# Patient Record
Sex: Male | Born: 1960 | Race: White | Hispanic: No | Marital: Married | Smoking: Current some day smoker
Health system: Southern US, Community
[De-identification: ages and names within clinical notes are randomized; demographics above are authoritative.]

## PROBLEM LIST (undated history)

## (undated) DIAGNOSIS — M1990 Unspecified osteoarthritis, unspecified site: Secondary | ICD-10-CM

## (undated) DIAGNOSIS — G4730 Sleep apnea, unspecified: Secondary | ICD-10-CM

## (undated) HISTORY — PX: ANTERIOR CRUCIATE LIGAMENT REPAIR: SHX115

---

## 1998-07-30 ENCOUNTER — Ambulatory Visit (HOSPITAL_BASED_OUTPATIENT_CLINIC_OR_DEPARTMENT_OTHER): Admission: RE | Admit: 1998-07-30 | Discharge: 1998-07-30 | Payer: Self-pay | Admitting: *Deleted

## 2015-11-02 ENCOUNTER — Other Ambulatory Visit: Payer: Self-pay | Admitting: Orthopaedic Surgery

## 2015-11-18 ENCOUNTER — Encounter (HOSPITAL_COMMUNITY): Payer: Self-pay

## 2015-11-18 ENCOUNTER — Encounter (HOSPITAL_COMMUNITY)
Admission: RE | Admit: 2015-11-18 | Discharge: 2015-11-18 | Disposition: A | Discharge status: Home / Self Care | Payer: BLUE CROSS/BLUE SHIELD | Source: Ambulatory Visit | Attending: Orthopaedic Surgery | Admitting: Orthopaedic Surgery

## 2015-11-18 DIAGNOSIS — Z01812 Encounter for preprocedural laboratory examination: Secondary | ICD-10-CM | POA: Insufficient documentation

## 2015-11-18 DIAGNOSIS — Z01810 Encounter for preprocedural cardiovascular examination: Secondary | ICD-10-CM | POA: Insufficient documentation

## 2015-11-18 DIAGNOSIS — M170 Bilateral primary osteoarthritis of knee: Secondary | ICD-10-CM | POA: Diagnosis not present

## 2015-11-18 HISTORY — DX: Unspecified osteoarthritis, unspecified site: M19.90

## 2015-11-18 HISTORY — DX: Sleep apnea, unspecified: G47.30

## 2015-11-18 LAB — CBC WITH DIFFERENTIAL/PLATELET
Basophils Absolute: 0 10*3/uL (ref 0.0–0.1)
Basophils Relative: 0 %
EOS ABS: 0.2 10*3/uL (ref 0.0–0.7)
Eosinophils Relative: 2 %
HEMATOCRIT: 43.5 % (ref 39.0–52.0)
HEMOGLOBIN: 14.9 g/dL (ref 13.0–17.0)
LYMPHS ABS: 3.1 10*3/uL (ref 0.7–4.0)
Lymphocytes Relative: 30 %
MCH: 29.4 pg (ref 26.0–34.0)
MCHC: 34.3 g/dL (ref 30.0–36.0)
MCV: 86 fL (ref 78.0–100.0)
MONO ABS: 0.7 10*3/uL (ref 0.1–1.0)
MONOS PCT: 7 %
NEUTROS ABS: 6.2 10*3/uL (ref 1.7–7.7)
NEUTROS PCT: 61 %
Platelets: 216 10*3/uL (ref 150–400)
RBC: 5.06 MIL/uL (ref 4.22–5.81)
RDW: 12.9 % (ref 11.5–15.5)
WBC: 10.2 10*3/uL (ref 4.0–10.5)

## 2015-11-18 LAB — URINALYSIS, ROUTINE W REFLEX MICROSCOPIC
Bilirubin Urine: NEGATIVE
GLUCOSE, UA: NEGATIVE mg/dL
HGB URINE DIPSTICK: NEGATIVE
Ketones, ur: NEGATIVE mg/dL
Leukocytes, UA: NEGATIVE
Nitrite: NEGATIVE
PH: 7.5 (ref 5.0–8.0)
Protein, ur: NEGATIVE mg/dL
SPECIFIC GRAVITY, URINE: 1.023 (ref 1.005–1.030)

## 2015-11-18 LAB — TYPE AND SCREEN
ABO/RH(D): O POS
Antibody Screen: NEGATIVE

## 2015-11-18 LAB — ABO/RH: ABO/RH(D): O POS

## 2015-11-18 LAB — COMPREHENSIVE METABOLIC PANEL
ALK PHOS: 61 U/L (ref 38–126)
ALT: 34 U/L (ref 17–63)
ANION GAP: 8 (ref 5–15)
AST: 25 U/L (ref 15–41)
Albumin: 4.1 g/dL (ref 3.5–5.0)
BILIRUBIN TOTAL: 0.7 mg/dL (ref 0.3–1.2)
BUN: 7 mg/dL (ref 6–20)
CALCIUM: 8.9 mg/dL (ref 8.9–10.3)
CO2: 26 mmol/L (ref 22–32)
Chloride: 105 mmol/L (ref 101–111)
Creatinine, Ser: 0.69 mg/dL (ref 0.61–1.24)
Glucose, Bld: 105 mg/dL — ABNORMAL HIGH (ref 65–99)
POTASSIUM: 3.8 mmol/L (ref 3.5–5.1)
Sodium: 139 mmol/L (ref 135–145)
TOTAL PROTEIN: 6.9 g/dL (ref 6.5–8.1)

## 2015-11-18 LAB — PROTIME-INR
INR: 0.96
PROTHROMBIN TIME: 12.8 s (ref 11.4–15.2)

## 2015-11-18 LAB — APTT: aPTT: 27 seconds (ref 24–36)

## 2015-11-18 LAB — SURGICAL PCR SCREEN
MRSA, PCR: NEGATIVE
STAPHYLOCOCCUS AUREUS: POSITIVE — AB

## 2015-11-18 LAB — C-REACTIVE PROTEIN

## 2015-11-18 LAB — SEDIMENTATION RATE: Sed Rate: 3 mm/hr (ref 0–16)

## 2015-11-18 NOTE — Pre-Procedure Instructions (Signed)
    Daniel Rivas  11/18/2015      [id number] Otto- [place], [place] - [address]  [place] [id number] Phone: [phone] Fax: [phone]    Your procedure is scheduled on 11/25/15.  [facility] at 530 A.M.  Call this number if you have problems the morning of surgery:  [phone]   Remember:  Do not eat food or drink liquids after midnight.  Take these medicines the morning of surgery with A SIP OF WATER none   Do not wear jewelry, make-up or nail polish.  Do not wear lotions, powders, or perfumes, or deoderant.  Do not shave 48 hours prior to surgery.  Men may shave face and neck.  Do not bring valuables to the hospital.  [facility] is not responsible for any belongings or valuables.  Contacts, dentures or bridgework may not be worn into surgery.  Leave your suitcase in the car.  After surgery it may be brought to your room.  For patients admitted to the hospital, discharge time will be determined by your treatment team.  Patients discharged the day of surgery will not be allowed to drive home.   Name and phone number of your driver:   Special instructions:  Do not take any aspirin,anti-inflammatories,vitamins,or herbal supplements 5-7 days prior to surgery. Please read over the following fact sheets that you were given. MRSA Information

## 2015-11-22 MED ORDER — TRANEXAMIC ACID 1000 MG/10ML IV SOLN
1000.0000 mg | INTRAVENOUS | Status: AC
  Administered 2015-11-25: 1000 mg via INTRAVENOUS
  Filled 2015-11-22 (×2): qty 10

## 2015-11-24 MED ORDER — DEXTROSE 5 % IV SOLN
3.0000 g | INTRAVENOUS | Status: DC
  Filled 2015-11-24: qty 3000

## 2015-11-24 MED ORDER — BUPIVACAINE LIPOSOME 1.3 % IJ SUSP
20.0000 mL | INTRAMUSCULAR | Status: DC
  Filled 2015-11-24 (×2): qty 20

## 2015-11-24 MED ORDER — CEFAZOLIN SODIUM-DEXTROSE 2-4 GM/100ML-% IV SOLN
2.0000 g | Freq: Once | INTRAVENOUS | Status: AC
  Administered 2015-11-25 (×2): 2 g via INTRAVENOUS
  Filled 2015-11-24: qty 100

## 2015-11-24 NOTE — Anesthesia Preprocedure Evaluation (Addendum)
Anesthesia Evaluation  Patient identified by MRN, date of birth, ID band Patient awake    Reviewed: Allergy & Precautions, H&P , NPO status , Patient's Chart, lab work & pertinent test results  Airway Mallampati: III  TM Distance: >3 FB Neck ROM: Full    Dental no notable dental hx. (+) Dental Advidsory Given, Teeth Intact   Pulmonary sleep apnea and Continuous Positive Airway Pressure Ventilation , Current Smoker,    Pulmonary exam normal breath sounds clear to auscultation       Cardiovascular negative cardio ROS   Rhythm:Regular Rate:Normal     Neuro/Psych negative neurological ROS  negative psych ROS   GI/Hepatic negative GI ROS, Neg liver ROS,   Endo/Other  Morbid obesity  Renal/GU negative Renal ROS  negative genitourinary   Musculoskeletal  (+) Arthritis , Osteoarthritis,    Abdominal   Peds  Hematology negative hematology ROS (+)   Anesthesia Other Findings   Reproductive/Obstetrics negative OB ROS                           Anesthesia Physical Anesthesia Plan  ASA: III  Anesthesia Plan: MAC and Spinal   Post-op Pain Management:    Induction: Intravenous  Airway Management Planned: Simple Face Mask  Additional Equipment:   Intra-op Plan:   Post-operative Plan:   Informed Consent: I have reviewed the patients History and Physical, chart, labs and discussed the procedure including the risks, benefits and alternatives for the proposed anesthesia with the patient or authorized representative who has indicated his/her understanding and acceptance.   Dental advisory given and Dental Advisory Given  Plan Discussed with: CRNA  Anesthesia Plan Comments:        Anesthesia Quick Evaluation

## 2015-11-25 ENCOUNTER — Inpatient Hospital Stay (HOSPITAL_COMMUNITY): Payer: BLUE CROSS/BLUE SHIELD | Admitting: Anesthesiology

## 2015-11-25 ENCOUNTER — Inpatient Hospital Stay (HOSPITAL_COMMUNITY): Payer: BLUE CROSS/BLUE SHIELD

## 2015-11-25 ENCOUNTER — Encounter (HOSPITAL_COMMUNITY): Payer: Self-pay | Admitting: *Deleted

## 2015-11-25 ENCOUNTER — Inpatient Hospital Stay (HOSPITAL_COMMUNITY)
Admission: RE | Admit: 2015-11-25 | Discharge: 2015-11-27 | DRG: 462 | Disposition: A | Discharge status: Home Health | Payer: BLUE CROSS/BLUE SHIELD | Source: Ambulatory Visit | Attending: Orthopaedic Surgery | Admitting: Orthopaedic Surgery

## 2015-11-25 ENCOUNTER — Encounter (HOSPITAL_COMMUNITY)
Admission: RE | Disposition: A | Discharge status: Home Health | Payer: Self-pay | Source: Ambulatory Visit | Attending: Orthopaedic Surgery

## 2015-11-25 DIAGNOSIS — D62 Acute posthemorrhagic anemia: Secondary | ICD-10-CM | POA: Diagnosis not present

## 2015-11-25 DIAGNOSIS — F17290 Nicotine dependence, other tobacco product, uncomplicated: Secondary | ICD-10-CM | POA: Diagnosis present

## 2015-11-25 DIAGNOSIS — Z96659 Presence of unspecified artificial knee joint: Secondary | ICD-10-CM

## 2015-11-25 DIAGNOSIS — Z96652 Presence of left artificial knee joint: Secondary | ICD-10-CM

## 2015-11-25 DIAGNOSIS — M170 Bilateral primary osteoarthritis of knee: Principal | ICD-10-CM | POA: Diagnosis present

## 2015-11-25 DIAGNOSIS — G4730 Sleep apnea, unspecified: Secondary | ICD-10-CM | POA: Diagnosis present

## 2015-11-25 DIAGNOSIS — Z6841 Body Mass Index (BMI) 40.0 and over, adult: Secondary | ICD-10-CM

## 2015-11-25 DIAGNOSIS — M25569 Pain in unspecified knee: Secondary | ICD-10-CM | POA: Diagnosis present

## 2015-11-25 HISTORY — PX: TOTAL KNEE ARTHROPLASTY: SHX125

## 2015-11-25 LAB — CBC
HCT: 38.1 % — ABNORMAL LOW (ref 39.0–52.0)
Hemoglobin: 12.4 g/dL — ABNORMAL LOW (ref 13.0–17.0)
MCH: 28.6 pg (ref 26.0–34.0)
MCHC: 32.5 g/dL (ref 30.0–36.0)
MCV: 87.8 fL (ref 78.0–100.0)
PLATELETS: 204 10*3/uL (ref 150–400)
RBC: 4.34 MIL/uL (ref 4.22–5.81)
RDW: 13.1 % (ref 11.5–15.5)
WBC: 19.9 10*3/uL — ABNORMAL HIGH (ref 4.0–10.5)

## 2015-11-25 LAB — CREATININE, SERUM
Creatinine, Ser: 0.94 mg/dL (ref 0.61–1.24)
GFR calc Af Amer: >60 mL/min (ref >60)
GFR calc non Af Amer: >60 mL/min (ref >60)

## 2015-11-25 SURGERY — ARTHROPLASTY, KNEE, BILATERAL, TOTAL
Anesthesia: Monitor Anesthesia Care | Site: Knee | Laterality: Bilateral

## 2015-11-25 MED ORDER — SODIUM CHLORIDE 0.9 % IR SOLN
Status: DC | PRN
  Administered 2015-11-25 (×2): 1

## 2015-11-25 MED ORDER — ONDANSETRON HCL 4 MG PO TABS: 4.0000 mg | ORAL_TABLET | Freq: Four times a day (QID) | ORAL | Status: DC | PRN

## 2015-11-25 MED ORDER — ACETAMINOPHEN 500 MG PO TABS
1000.0000 mg | ORAL_TABLET | Freq: Once | ORAL | Status: AC
  Administered 2015-11-25: 1000 mg via ORAL
  Filled 2015-11-25 (×2): qty 2

## 2015-11-25 MED ORDER — GABAPENTIN 300 MG PO CAPS
300.0000 mg | ORAL_CAPSULE | Freq: Once | ORAL | Status: AC
  Administered 2015-11-25: 300 mg via ORAL
  Filled 2015-11-25: qty 1

## 2015-11-25 MED ORDER — OXYCODONE HCL 5 MG PO TABS
5.0000 mg | ORAL_TABLET | ORAL | Status: DC | PRN
  Administered 2015-11-26: 15 mg via ORAL
  Filled 2015-11-25: qty 3

## 2015-11-25 MED ORDER — ACETAMINOPHEN 325 MG PO TABS
650.0000 mg | ORAL_TABLET | Freq: Four times a day (QID) | ORAL | Status: DC | PRN
  Administered 2015-11-26: 650 mg via ORAL
  Filled 2015-11-25: qty 2

## 2015-11-25 MED ORDER — PROPOFOL 500 MG/50ML IV EMUL
INTRAVENOUS | Status: DC | PRN
  Administered 2015-11-25 (×3): via INTRAVENOUS
  Administered 2015-11-25: 65 ug/kg/min via INTRAVENOUS

## 2015-11-25 MED ORDER — SODIUM CHLORIDE 0.9 % IV SOLN
INTRAVENOUS | Status: DC
  Administered 2015-11-25: 16:00:00 via INTRAVENOUS

## 2015-11-25 MED ORDER — METHOCARBAMOL 750 MG PO TABS: 750.0000 mg | ORAL_TABLET | Freq: Two times a day (BID) | ORAL | 0 refills | Status: AC | PRN

## 2015-11-25 MED ORDER — OXYCODONE HCL 5 MG PO TABS: 5.0000 mg | ORAL_TABLET | ORAL | 0 refills | Status: AC | PRN

## 2015-11-25 MED ORDER — POVIDONE-IODINE 10 % EX SOLN
CUTANEOUS | Status: DC | PRN
  Administered 2015-11-25 (×2): 1 via TOPICAL

## 2015-11-25 MED ORDER — PROPOFOL 10 MG/ML IV BOLUS
INTRAVENOUS | Status: AC
  Filled 2015-11-25: qty 20

## 2015-11-25 MED ORDER — PHENYLEPHRINE HCL 10 MG/ML IJ SOLN
INTRAMUSCULAR | Status: DC | PRN
  Administered 2015-11-25: 80 ug via INTRAVENOUS

## 2015-11-25 MED ORDER — PROPOFOL 500 MG/50ML IV EMUL
INTRAVENOUS | Status: AC
  Filled 2015-11-25: qty 50

## 2015-11-25 MED ORDER — ONDANSETRON HCL 4 MG PO TABS: 4.0000 mg | ORAL_TABLET | Freq: Three times a day (TID) | ORAL | 0 refills | Status: AC | PRN

## 2015-11-25 MED ORDER — PROMETHAZINE HCL 25 MG PO TABS
25.0000 mg | ORAL_TABLET | Freq: Four times a day (QID) | ORAL | 1 refills | Status: AC | PRN
Start: 2015-11-25 — End: ?

## 2015-11-25 MED ORDER — CEFAZOLIN SODIUM-DEXTROSE 2-4 GM/100ML-% IV SOLN
2.0000 g | Freq: Four times a day (QID) | INTRAVENOUS | Status: AC
  Administered 2015-11-25 (×2): 2 g via INTRAVENOUS
  Filled 2015-11-25 (×2): qty 100

## 2015-11-25 MED ORDER — ENOXAPARIN SODIUM 30 MG/0.3ML ~~LOC~~ SOLN
30.0000 mg | Freq: Two times a day (BID) | SUBCUTANEOUS | Status: DC
  Administered 2015-11-26 – 2015-11-27 (×3): 30 mg via SUBCUTANEOUS
  Filled 2015-11-25 (×3): qty 0.3

## 2015-11-25 MED ORDER — 0.9 % SODIUM CHLORIDE (POUR BTL) OPTIME
TOPICAL | Status: DC | PRN
  Administered 2015-11-25 (×2): 500 mL

## 2015-11-25 MED ORDER — PHENYLEPHRINE 40 MCG/ML (10ML) SYRINGE FOR IV PUSH (FOR BLOOD PRESSURE SUPPORT)
PREFILLED_SYRINGE | INTRAVENOUS | Status: AC
  Filled 2015-11-25: qty 10

## 2015-11-25 MED ORDER — KETOROLAC TROMETHAMINE 30 MG/ML IJ SOLN: 30.0000 mg | Freq: Four times a day (QID) | INTRAMUSCULAR | Status: AC | PRN

## 2015-11-25 MED ORDER — MIDAZOLAM HCL 2 MG/2ML IJ SOLN
INTRAMUSCULAR | Status: AC
  Filled 2015-11-25: qty 2

## 2015-11-25 MED ORDER — CELECOXIB 200 MG PO CAPS
200.0000 mg | ORAL_CAPSULE | Freq: Once | ORAL | Status: AC
  Administered 2015-11-25: 200 mg via ORAL
  Filled 2015-11-25: qty 1

## 2015-11-25 MED ORDER — ENOXAPARIN SODIUM 40 MG/0.4ML ~~LOC~~ SOLN: 40.0000 mg | Freq: Every day | SUBCUTANEOUS | 0 refills | Status: AC

## 2015-11-25 MED ORDER — FENTANYL CITRATE (PF) 100 MCG/2ML IJ SOLN
INTRAMUSCULAR | Status: DC | PRN
  Administered 2015-11-25 (×2): 25 ug via INTRAVENOUS
  Administered 2015-11-25: 50 ug via INTRAVENOUS
  Administered 2015-11-25: 100 ug via INTRAVENOUS

## 2015-11-25 MED ORDER — DIPHENHYDRAMINE HCL 12.5 MG/5ML PO ELIX: 25.0000 mg | ORAL_SOLUTION | ORAL | Status: DC | PRN

## 2015-11-25 MED ORDER — FENTANYL CITRATE (PF) 100 MCG/2ML IJ SOLN
INTRAMUSCULAR | Status: AC
  Filled 2015-11-25: qty 2

## 2015-11-25 MED ORDER — CEFAZOLIN SODIUM 1 G IJ SOLR
INTRAMUSCULAR | Status: AC
  Filled 2015-11-25: qty 20

## 2015-11-25 MED ORDER — CHLORHEXIDINE GLUCONATE 4 % EX LIQD: 60.0000 mL | Freq: Once | CUTANEOUS | Status: DC

## 2015-11-25 MED ORDER — OXYCODONE HCL ER 15 MG PO T12A
15.0000 mg | EXTENDED_RELEASE_TABLET | Freq: Two times a day (BID) | ORAL | Status: DC
  Administered 2015-11-25 – 2015-11-27 (×5): 15 mg via ORAL
  Filled 2015-11-25 (×5): qty 1

## 2015-11-25 MED ORDER — PHENOL 1.4 % MT LIQD
1.0000 | OROMUCOSAL | Status: DC | PRN
Start: 2015-11-25 — End: 2015-11-27

## 2015-11-25 MED ORDER — PROPOFOL 10 MG/ML IV BOLUS
INTRAVENOUS | Status: DC | PRN
  Administered 2015-11-25: 30 mg via INTRAVENOUS

## 2015-11-25 MED ORDER — BUPIVACAINE HCL (PF) 0.5 % IJ SOLN
INTRAMUSCULAR | Status: DC | PRN
Start: 2015-11-25 — End: 2015-11-25
  Administered 2015-11-25: 15 mL via INTRATHECAL

## 2015-11-25 MED ORDER — MENTHOL 3 MG MT LOZG
1.0000 | LOZENGE | OROMUCOSAL | Status: DC | PRN
Start: 2015-11-25 — End: 2015-11-27

## 2015-11-25 MED ORDER — SODIUM CHLORIDE 0.9 % IJ SOLN
INTRAMUSCULAR | Status: DC | PRN
  Administered 2015-11-25 (×2): 40 mL

## 2015-11-25 MED ORDER — MAGNESIUM CITRATE PO SOLN: 1.0000 | Freq: Once | ORAL | Status: DC | PRN

## 2015-11-25 MED ORDER — SENNOSIDES-DOCUSATE SODIUM 8.6-50 MG PO TABS
1.0000 | ORAL_TABLET | Freq: Every evening | ORAL | 1 refills | Status: AC | PRN
Start: 2015-11-25 — End: ?

## 2015-11-25 MED ORDER — ALUM & MAG HYDROXIDE-SIMETH 200-200-20 MG/5ML PO SUSP: 30.0000 mL | ORAL | Status: DC | PRN

## 2015-11-25 MED ORDER — LACTATED RINGERS IV SOLN
INTRAVENOUS | Status: DC | PRN
  Administered 2015-11-25 (×3): via INTRAVENOUS

## 2015-11-25 MED ORDER — PROPOFOL 1000 MG/100ML IV EMUL
INTRAVENOUS | Status: AC
  Filled 2015-11-25: qty 100

## 2015-11-25 MED ORDER — DEXAMETHASONE SODIUM PHOSPHATE 10 MG/ML IJ SOLN
10.0000 mg | Freq: Once | INTRAMUSCULAR | Status: AC
  Administered 2015-11-26: 10 mg via INTRAVENOUS
  Filled 2015-11-25: qty 1

## 2015-11-25 MED ORDER — METHOCARBAMOL 1000 MG/10ML IJ SOLN
500.0000 mg | Freq: Four times a day (QID) | INTRAVENOUS | Status: DC | PRN
  Filled 2015-11-25: qty 5

## 2015-11-25 MED ORDER — METHOCARBAMOL 500 MG PO TABS
500.0000 mg | ORAL_TABLET | Freq: Four times a day (QID) | ORAL | Status: DC | PRN
  Administered 2015-11-25 – 2015-11-26 (×2): 500 mg via ORAL
  Filled 2015-11-25 (×2): qty 1

## 2015-11-25 MED ORDER — BUPIVACAINE LIPOSOME 1.3 % IJ SUSP
INTRAMUSCULAR | Status: DC | PRN
  Administered 2015-11-25 (×2): 20 mL

## 2015-11-25 MED ORDER — ONDANSETRON HCL 4 MG/2ML IJ SOLN: 4.0000 mg | Freq: Four times a day (QID) | INTRAMUSCULAR | Status: DC | PRN

## 2015-11-25 MED ORDER — MORPHINE SULFATE (PF) 2 MG/ML IV SOLN: 1.0000 mg | INTRAVENOUS | Status: DC | PRN

## 2015-11-25 MED ORDER — ACETAMINOPHEN 500 MG PO TABS
1000.0000 mg | ORAL_TABLET | Freq: Four times a day (QID) | ORAL | Status: AC
  Administered 2015-11-25 – 2015-11-26 (×4): 1000 mg via ORAL
  Filled 2015-11-25 (×4): qty 2

## 2015-11-25 MED ORDER — ACETAMINOPHEN 650 MG RE SUPP: 650.0000 mg | Freq: Four times a day (QID) | RECTAL | Status: DC | PRN

## 2015-11-25 MED ORDER — LIDOCAINE HCL (CARDIAC) 20 MG/ML IV SOLN
INTRAVENOUS | Status: DC | PRN
  Administered 2015-11-25: 20 mg via INTRAVENOUS

## 2015-11-25 MED ORDER — BUPIVACAINE LIPOSOME 1.3 % IJ SUSP
20.0000 mL | INTRAMUSCULAR | Status: DC
  Filled 2015-11-25: qty 20

## 2015-11-25 MED ORDER — ONDANSETRON HCL 4 MG/2ML IJ SOLN
INTRAMUSCULAR | Status: AC
  Filled 2015-11-25: qty 2

## 2015-11-25 MED ORDER — OXYCODONE HCL ER 10 MG PO T12A: 10.0000 mg | EXTENDED_RELEASE_TABLET | Freq: Two times a day (BID) | ORAL | 0 refills | Status: AC

## 2015-11-25 MED ORDER — METOCLOPRAMIDE HCL 5 MG PO TABS: 5.0000 mg | ORAL_TABLET | Freq: Three times a day (TID) | ORAL | Status: DC | PRN

## 2015-11-25 MED ORDER — PHENYLEPHRINE HCL 10 MG/ML IJ SOLN
INTRAMUSCULAR | Status: DC | PRN
  Administered 2015-11-25: 25 ug/min via INTRAVENOUS
  Administered 2015-11-25: 10:00:00 via INTRAVENOUS

## 2015-11-25 MED ORDER — METOCLOPRAMIDE HCL 5 MG/ML IJ SOLN: 5.0000 mg | Freq: Three times a day (TID) | INTRAMUSCULAR | Status: DC | PRN

## 2015-11-25 MED ORDER — CEFAZOLIN SODIUM-DEXTROSE 2-4 GM/100ML-% IV SOLN
INTRAVENOUS | Status: AC
Start: 2015-11-25 — End: 2015-11-25
  Filled 2015-11-25: qty 100

## 2015-11-25 MED ORDER — POLYETHYLENE GLYCOL 3350 17 G PO PACK: 17.0000 g | PACK | Freq: Every day | ORAL | Status: DC | PRN

## 2015-11-25 MED ORDER — HYDROMORPHONE HCL 1 MG/ML IJ SOLN: 0.2500 mg | INTRAMUSCULAR | Status: DC | PRN

## 2015-11-25 MED ORDER — MIDAZOLAM HCL 5 MG/5ML IJ SOLN
INTRAMUSCULAR | Status: DC | PRN
  Administered 2015-11-25: 2 mg via INTRAVENOUS

## 2015-11-25 MED ORDER — SORBITOL 70 % SOLN: 30.0000 mL | Freq: Every day | Status: DC | PRN

## 2015-11-25 SURGICAL SUPPLY — 75 items
ALCOHOL ISOPROPYL (RUBBING) (MISCELLANEOUS) ×2 IMPLANT
BAG DECANTER FOR FLEXI CONT (MISCELLANEOUS) ×2 IMPLANT
BANDAGE ACE 6X5 VEL STRL LF (GAUZE/BANDAGES/DRESSINGS) ×2 IMPLANT
BANDAGE ESMARK 6X9 LF (GAUZE/BANDAGES/DRESSINGS) ×1 IMPLANT
BENZOIN TINCTURE PRP APPL 2/3 (GAUZE/BANDAGES/DRESSINGS) ×2 IMPLANT
BLADE SAW SGTL 13.0X1.19X90.0M (BLADE) ×4 IMPLANT
BNDG COHESIVE 6X5 TAN STRL LF (GAUZE/BANDAGES/DRESSINGS) ×2 IMPLANT
BNDG ESMARK 6X9 LF (GAUZE/BANDAGES/DRESSINGS) ×2
BONE CEMENT PALACOS R-G (Orthopedic Implant) ×8 IMPLANT
BOWL SMART MIX CTS (DISPOSABLE) ×4 IMPLANT
CAPT KNEE TOTAL 3 ×4 IMPLANT
CEMENT BONE PALACOS R-G (Orthopedic Implant) ×4 IMPLANT
CLSR STERI-STRIP ANTIMIC 1/2X4 (GAUZE/BANDAGES/DRESSINGS) IMPLANT
COVER SURGICAL LIGHT HANDLE (MISCELLANEOUS) ×4 IMPLANT
CUFF TOURNIQUET SINGLE 34IN LL (TOURNIQUET CUFF) ×4 IMPLANT
CUFF TOURNIQUET SINGLE 44IN (TOURNIQUET CUFF) IMPLANT
DRAPE EXTREMITY T 121X128X90 (DRAPE) ×2 IMPLANT
DRAPE HALF SHEET 40X57 (DRAPES) ×8 IMPLANT
DRAPE IMP U-DRAPE 54X76 (DRAPES) ×4 IMPLANT
DRAPE INCISE IOBAN 66X45 STRL (DRAPES) ×4 IMPLANT
DRAPE ORTHO SPLIT 77X108 STRL (DRAPES) ×4
DRAPE PROXIMA HALF (DRAPES) ×2 IMPLANT
DRAPE SURG 17X11 SM STRL (DRAPES) ×4 IMPLANT
DRAPE SURG ORHT 6 SPLT 77X108 (DRAPES) ×4 IMPLANT
DRAPE UNIVERSAL PACK (DRAPES) ×2 IMPLANT
DRESSING AQUACEL AG SP 3.5X10 (GAUZE/BANDAGES/DRESSINGS) ×1 IMPLANT
DRSG AQUACEL AG ADV 3.5X14 (GAUZE/BANDAGES/DRESSINGS) ×2 IMPLANT
DRSG AQUACEL AG SP 3.5X10 (GAUZE/BANDAGES/DRESSINGS) ×2
DURAPREP 26ML APPLICATOR (WOUND CARE) ×8 IMPLANT
ELECT CAUTERY BLADE 6.4 (BLADE) ×2 IMPLANT
ELECT REM PT RETURN 9FT ADLT (ELECTROSURGICAL) ×4
ELECTRODE REM PT RTRN 9FT ADLT (ELECTROSURGICAL) ×2 IMPLANT
GLOVE BIOGEL PI IND STRL 6 (GLOVE) ×2 IMPLANT
GLOVE BIOGEL PI IND STRL 7.0 (GLOVE) ×2 IMPLANT
GLOVE BIOGEL PI IND STRL 7.5 (GLOVE) ×2 IMPLANT
GLOVE BIOGEL PI INDICATOR 6 (GLOVE) ×2
GLOVE BIOGEL PI INDICATOR 7.0 (GLOVE) ×2
GLOVE BIOGEL PI INDICATOR 7.5 (GLOVE) ×2
GLOVE SKINSENSE NS SZ7.5 (GLOVE) ×5
GLOVE SKINSENSE STRL SZ7.5 (GLOVE) ×5 IMPLANT
GLOVE SURG SYN 7.5  E (GLOVE) ×4
GLOVE SURG SYN 7.5 E (GLOVE) ×4 IMPLANT
GOWN STRL REIN XL XLG (GOWN DISPOSABLE) ×4 IMPLANT
GOWN STRL REUS W/ TWL LRG LVL3 (GOWN DISPOSABLE) ×2 IMPLANT
GOWN STRL REUS W/TWL LRG LVL3 (GOWN DISPOSABLE) ×2
HANDPIECE INTERPULSE COAX TIP (DISPOSABLE) ×2
HOOD PEEL AWAY FLYTE STAYCOOL (MISCELLANEOUS) ×8 IMPLANT
KIT BASIN OR (CUSTOM PROCEDURE TRAY) ×2 IMPLANT
KIT ROOM TURNOVER OR (KITS) ×2 IMPLANT
MANIFOLD NEPTUNE II (INSTRUMENTS) ×2 IMPLANT
NEEDLE SPNL 18GX3.5 QUINCKE PK (NEEDLE) ×2 IMPLANT
NS IRRIG 1000ML POUR BTL (IV SOLUTION) ×4 IMPLANT
PACK TOTAL JOINT (CUSTOM PROCEDURE TRAY) ×2 IMPLANT
PAD ARMBOARD 7.5X6 YLW CONV (MISCELLANEOUS) ×4 IMPLANT
PEN SKIN MARKING BROAD (MISCELLANEOUS) ×2 IMPLANT
SAW OSC TIP CART 19.5X105X1.3 (SAW) ×2 IMPLANT
SEALER BIPOLAR AQUA 6.0 (INSTRUMENTS) ×2 IMPLANT
SET HNDPC FAN SPRY TIP SCT (DISPOSABLE) ×2 IMPLANT
STAPLER VISISTAT 35W (STAPLE) ×4 IMPLANT
STOCKINETTE IMPERVIOUS LG (DRAPES) ×2 IMPLANT
SUCTION FRAZIER HANDLE 10FR (MISCELLANEOUS) ×1
SUCTION TUBE FRAZIER 10FR DISP (MISCELLANEOUS) ×1 IMPLANT
SUT ETHILON 2 0 FS 18 (SUTURE) IMPLANT
SUT MNCRL AB 4-0 PS2 18 (SUTURE) IMPLANT
SUT VIC AB 0 CT1 27 (SUTURE) ×3
SUT VIC AB 0 CT1 27XBRD ANBCTR (SUTURE) ×3 IMPLANT
SUT VIC AB 1 CTX 27 (SUTURE) ×14 IMPLANT
SUT VIC AB 2-0 CT1 27 (SUTURE) ×4
SUT VIC AB 2-0 CT1 TAPERPNT 27 (SUTURE) ×4 IMPLANT
SYR 20CC LL (SYRINGE) ×2 IMPLANT
SYR 50ML LL SCALE MARK (SYRINGE) ×2 IMPLANT
TOWEL OR 17X24 6PK STRL BLUE (TOWEL DISPOSABLE) ×2 IMPLANT
TOWEL OR 17X26 10 PK STRL BLUE (TOWEL DISPOSABLE) ×2 IMPLANT
TRAY CATH 16FR W/PLASTIC CATH (SET/KITS/TRAYS/PACK) ×2 IMPLANT
WRAP KNEE MAXI GEL POST OP (GAUZE/BANDAGES/DRESSINGS) ×4 IMPLANT

## 2015-11-25 NOTE — Transfer of Care (Signed)
Immediate Anesthesia Transfer of Care Note  Patient: Daniel Rivas  Procedure(s) Performed: Procedure(s): TOTAL KNEE BILATERAL (Bilateral)  Patient Location: PACU  Anesthesia Type:Spinal  Level of Consciousness: awake, alert  and oriented  Airway & Oxygen Therapy: Patient Spontanous Breathing and Patient connected to nasal cannula oxygen  Post-op Assessment: Report given to RN and Post -op Vital signs reviewed and stable  Post vital signs: Reviewed and stable  Last Vitals:  Vitals:   11/25/15 0555 11/25/15 1223  BP: (!) 157/88 110/70  Pulse: 84 77  Resp: 20 19  Temp: 37.3 C 36.3 C    Last Pain:  Vitals:   11/25/15 0555  TempSrc: Oral      Patients Stated Pain Goal: 3 (11/25/15 0555)  Complications: No apparent anesthesia complications

## 2015-11-25 NOTE — Anesthesia Postprocedure Evaluation (Signed)
Anesthesia Post Note  Patient: Daniel Rivas  Procedure(s) Performed: Procedure(s) (LRB): TOTAL KNEE BILATERAL (Bilateral)  Patient location during evaluation: PACU Anesthesia Type: Spinal and MAC Level of consciousness: awake and alert Pain management: pain level controlled Vital Signs Assessment: post-procedure vital signs reviewed and stable Respiratory status: spontaneous breathing and respiratory function stable Cardiovascular status: blood pressure returned to baseline and stable Postop Assessment: spinal receding Anesthetic complications: no    Last Vitals:  Vitals:   11/25/15 1335 11/25/15 1353  BP: (!) 99/59 96/61  Pulse: 62 60  Resp: 17 13  Temp:      Last Pain:  Vitals:   11/25/15 1330  TempSrc:   PainSc: 0-No pain                 Marylynn Rigdon,W. EDMOND

## 2015-11-25 NOTE — Anesthesia Procedure Notes (Signed)
Procedure Name: MAC Date/Time: 11/25/2015 7:45 AM Performed by: Carmela RimaMARTINELLI, Sandip Power F Pre-anesthesia Checklist: Timeout performed, Patient being monitored, Suction available, Emergency Drugs available and Patient identified Oxygen Delivery Method: Simple face mask Placement Confirmation: positive ETCO2

## 2015-11-25 NOTE — Anesthesia Procedure Notes (Signed)
Spinal  Patient location during procedure: OR Start time: 11/25/2015 7:33 AM End time: 11/25/2015 7:38 AM Staffing Anesthesiologist: Gaynelle AduFITZGERALD, Devantae Babe Performed: anesthesiologist  Preanesthetic Checklist Completed: patient identified, surgical consent, pre-op evaluation, timeout performed, IV checked, risks and benefits discussed and monitors and equipment checked Spinal Block Patient position: sitting Prep: DuraPrep Patient monitoring: cardiac monitor, continuous pulse ox and blood pressure Approach: midline Location: L3-4 Injection technique: single-shot Needle Needle type: Pencan  Needle gauge: 24 G Needle length: 9 cm Assessment Sensory level: T8 Additional Notes Functioning IV was confirmed and monitors were applied. Sterile prep and drape, including hand hygiene and sterile gloves were used. The patient was positioned and the spine was prepped. The skin was anesthetized with lidocaine.  Free flow of clear CSF was obtained prior to injecting local anesthetic into the CSF.  The spinal needle aspirated freely following injection.  The needle was carefully withdrawn.  The patient tolerated the procedure well.

## 2015-11-25 NOTE — Anesthesia Procedure Notes (Signed)
Date/Time: 11/25/2015 8:10 AM Performed by: Bronson IngMARTINELLI, Katrece Roediger F Ventilation: Nasal airway inserted- appropriate to patient size

## 2015-11-25 NOTE — Evaluation (Signed)
Physical Therapy Evaluation Patient Details Name: Daniel HatchDwayne R Cooperman MRN: [id number] DOB: 12/02/1960 Today's Date: 11/25/2015   History of Present Illness  Pt s/p elective bilat TKA.  Clinical Impression  Pt is s/p bilat TKA resulting in the deficits listed below (see PT Problem List). Pt with active R knee bleeding, RN aware. Pt tolerated OOB well for first time. Pt will benefit from skilled PT to increase their independence and safety with mobility to allow discharge to the venue listed below.      Follow Up Recommendations Home health PT;Supervision/Assistance - 24 hour    Equipment Recommendations  Rolling walker with 5" wheels    Recommendations for Other Services       Precautions / Restrictions Precautions Precautions: Fall Precaution Comments: bilat TKA, active bleeding from R knee Restrictions Weight Bearing Restrictions: Yes RLE Weight Bearing: Weight bearing as tolerated LLE Weight Bearing: Weight bearing as tolerated      Mobility  Bed Mobility Overal bed mobility: Needs Assistance Bed Mobility: Supine to Sit     Supine to sit: Min guard;HOB elevated     General bed mobility comments: used bed rail  Transfers Overall transfer level: Needs assistance Equipment used: Rolling walker (2 wheeled) Transfers: Sit to/from [employer] CorporationStand;Stand Pivot Transfers Sit to Stand: Mod assist;Max assist Stand pivot transfers: Min assist       General transfer comment: once up pt minA, pt able to complete 5 steps to chair  Ambulation/Gait                Stairs            Wheelchair Mobility    Modified Rankin (Stroke Patients Only)       Balance Overall balance assessment: Needs assistance         Standing balance support: Bilateral upper extremity supported Standing balance-Leahy Scale: Poor Standing balance comment: need RW for safe standing                             Pertinent Vitals/Pain Pain Assessment: 0-10 Pain Score: 7  Pain  Location: bilat TKA Pain Descriptors / Indicators: Aching Pain Intervention(s): Premedicated before session    Home Living Family/patient expects to be discharged to:: Private residence Living Arrangements: Spouse/significant other Available Help at Discharge: Family;Available 24 hours/day Type of Home: House Home Access: Stairs to enter Entrance Stairs-Rails: None Entrance Stairs-Number of Steps: 2 Home Layout: Two level;Able to live on main level with bedroom/bathroom Home Equipment: Shower seat      Prior Function Level of Independence: Independent         Comments: works     Higher education careers adviserHand Dominance   Dominant Hand: Right    Extremity/Trunk Assessment   Upper Extremity Assessment: Overall WFL for tasks assessed           Lower Extremity Assessment: RLE deficits/detail;LLE deficits/detail RLE Deficits / Details: pt able to initiate LAQ and active knee flex LLE Deficits / Details: pt able to initiate LAQ and active knee flex  Cervical / Trunk Assessment: Normal  Communication   Communication: No difficulties  Cognition Arousal/Alertness: Awake/alert Behavior During Therapy: WFL for tasks assessed/performed Overall Cognitive Status: Within Functional Limits for tasks assessed                      General Comments General comments (skin integrity, edema, etc.): pt actively bleeding from R knee, RN aware and added more dressing however bleed threw during  transfer    Exercises Total Joint Exercises Ankle Circles/Pumps: AROM;Both;10 reps;Supine Quad Sets: AROM;Both;10 reps;Supine Knee Flexion: AAROM;Both;5 reps;Supine   Assessment/Plan    PT Assessment Patient needs continued PT services  PT Problem List Decreased strength;Decreased range of motion;Decreased activity tolerance;Decreased balance;Decreased mobility;Decreased coordination          PT Treatment Interventions DME instruction;Functional mobility training;Gait training;Stair training;Therapeutic  activities;Therapeutic exercise;Balance training    PT Goals (Current goals can be found in the Care Plan section)  Acute Rehab PT Goals Patient Stated Goal: home tomorrow PT Goal Formulation: With patient Time For Goal Achievement: 12/02/15 Potential to Achieve Goals: Good    Frequency 7X/week   Barriers to discharge        Co-evaluation               End of Session Equipment Utilized During Treatment: Gait belt Activity Tolerance: Patient tolerated treatment well Patient left: in chair;with call bell/phone within reach;with family/visitor present Nurse Communication: Mobility status         Time: 1610-1640 PT Time Calculation (min) (ACUTE ONLY): 30 min   Charges:   PT Evaluation $PT Eval Moderate Complexity: 1 Procedure PT Treatments $Therapeutic Activity: 8-22 mins   PT G Codes:        Alaysiah Browder M Saya Mccoll 11/25/2015, 4:50 PM   Lewis ShockAshly Savi Lastinger, PT, DPT Pager #: [phone] Office #: [phone]

## 2015-11-25 NOTE — Op Note (Signed)
Total Knee Arthroplasty Procedure Note Dewayne HatchDwayne R Upadhyay [id number] 11/25/2015   Preoperative diagnosis: Bilateral knee osteoarthritis  Postoperative diagnosis:same  Operative procedure: Bilateral total knee arthroplasty. CPT [phone] x 2  Surgeon: N. Glee ArvinMichael Xu, MD  Assistants: April Green, RNFA  Anesthesia: Spinal, regional  Tourniquet time: less than 2 hrs per side  Implants used: [employer]ep[employer] Legion Femur: 6 PS for both Tibia: Genesis II 5 for both Patella: 32 mm, 7.5 for both Polyethylene: 9 mm on left, 10 mm on right  Indication: Derl R Muffley is a 55 y.o. year old male with a history of knee pain. Having failed conservative management, the patient elected to proceed with a total knee arthroplasty.  We have reviewed the risk and benefits of the surgery and they elected to proceed after voicing understanding.  Procedure:  After informed consent was obtained and understanding of the risk were voiced including but not limited to bleeding, infection, damage to surrounding structures including nerves and vessels, blood clots, leg length inequality and the failure to achieve desired results, the operative extremity was marked with verbal confirmation of the patient in the holding area.   The patient was then brought to the operating room and transported to the operating room table in the supine position.  A tourniquet was applied to the operative extremity around the upper thigh. The operative limb was then prepped and draped in the usual sterile fashion and preoperative antibiotics were administered.  A time out was performed prior to the start of surgery confirming the correct extremity, preoperative antibiotic administration, as well as team members, implants and instruments available for the case. Correct surgical site was also confirmed with preoperative radiographs. The limb was then elevated for exsanguination and the tourniquet was inflated. A midline incision was made and a  standard medial parapatellar approach was performed.  The patella was prepared and sized to a 32 mm.  A cover was placed on the patella for protection from retractors.  We then turned our attention to the femur. Posterior cruciate ligament was sacrificed. Start site was drilled in the femur and the intramedullary distal femoral cutting guide was placed, set at 5 degrees valgus, taking 11 mm of distal resection. The distal cut was made. Osteophytes were then removed. Next, the proximal tibial cutting guide was placed with appropriate slope, varus/valgus alignment and depth of resection. The proximal tibial cut was made. Gap blocks were then used to assess the extension gap and alignment, and appropriate soft tissue releases were performed. Attention was turned back to the femur, which was sized using the sizing guide to a size 6. Appropriate rotation of the femoral component was determined using epicondylar axis, Whiteside's line, and assessing the flexion gap under ligament tension. The appropriate size 4-in-1 cutting block was placed and cuts were made. Posterior femoral osteophytes and uncapped bone were then removed with the curved osteotome. The tibia was sized for a size 5 component. The femoral box-cutting guide was placed and prepared for a PS femoral component. Trial components were placed, and stability was checked in full extension, mid-flexion, and deep flexion. Proper tibial rotation was determined and marked.  The patella tracked well without a lateral release. Trial components were then removed and tibial preparation performed. A posterior capsular injection comprising of 20 cc of 1.3% exparel and 40 cc of normal saline was performed for postoperative pain control. The bony surfaces were irrigated with a pulse lavage and then dried. Bone cement was vacuum mixed on the back  table, and the final components sized above were cemented into place. After cement had finished curing, excess cement was  removed. The stability of the construct was re-evaluated throughout a range of motion and found to be acceptable. The trial liner was removed, the knee was copiously irrigated, and the knee was re-evaluated for any excess bone debris. The real polyethylene liner, 9 mm thick, was inserted and checked to ensure the locking mechanism had engaged appropriately. The tourniquet was deflated and hemostasis was achieved. The wound was irrigated with dilute betadine in normal saline, and then again with normal saline. A drain was not placed. Capsular closure was performed with a #1 vicryl, subcutaneous fat closed with a 0 vicryl suture, then subcutaneous tissue closed with interrupted 2.0 vicryl suture. The skin was then closed with a staples. A sterile dressing was applied.   We then re-prepped and draped for the right knee identically to the left knee.  We repeated the steps for the right knee.  Component sizes were identical except for the final poly liner which was a 10 mm.  Capsular closure was performed with a #1 vicryl, subcutaneous fat closed with a 0 vicryl suture, then subcutaneous tissue closed with interrupted 2.0 vicryl suture. The skin was then closed with a staples. A sterile dressing was applied.   The patient was awakened in the operating room and taken to recovery in stable condition. All sponge, needle, and instrument counts were correct at the end of the case.  Position: supine  Complications: none.  Time Out: performed   Drains/Packing: none  Estimated blood loss: 75 cc  Returned to Recovery Room: in good condition.   Antibiotics: yes   Mechanical VTE (DVT) Prophylaxis: sequential compression devices, TED thigh-high  Chemical VTE (DVT) Prophylaxis: lovenox  Fluid Replacement  Crystalloid: see anesthesia record Blood: none  FFP: none   Specimens Removed: 1 to pathology   Sponge and Instrument Count Correct? yes   PACU: portable radiograph - knee AP and Lateral   Admission:  inpatient status  Plan/RTC: Return in 2 weeks for wound check.   Weight Bearing/Load Lower Extremity: full   N. Glee ArvinMichael Xu, MD [facility] [phone] 12:07 PM

## 2015-11-25 NOTE — Progress Notes (Signed)
Orthopedic Tech Progress Note Patient Details:  Daniel HatchDwayne R Desmith 07/18/1960 [id number]  CPM Left Knee CPM Left Knee: On Left Knee Flexion (Degrees): 90 Left Knee Extension (Degrees): 0 Additional Comments: Trapeze bar CPM Right Knee CPM Right Knee: On Right Knee Flexion (Degrees): 90 Right Knee Extension (Degrees): 0   Saul FordyceJennifer C Nixie Laube 11/25/2015, 1:26 PM

## 2015-11-25 NOTE — H&P (Signed)
    PREOPERATIVE H&P  Chief Complaint: bilateral knee degenerative joint disease  HPI: Daniel Rivas is a 55 y.o. male who presents for surgical treatment of bilateral knee degenerative joint disease.  He denies any changes in medical history.  Past Medical History:  Diagnosis Date  . Arthritis   . Sleep apnea    cpap   Past Surgical History:  Procedure Laterality Date  . ANTERIOR CRUCIATE LIGAMENT REPAIR     bil   Social History   Social History  . Marital status: Married    Spouse name: N/A  . Number of children: N/A  . Years of education: N/A   Social History Main Topics  . Smoking status: Current Some Day Smoker    Types: Cigars  . Smokeless tobacco: Never Used  . Alcohol use Yes     Comment: weekly  . Drug use: No  . Sexual activity: Not Asked   Other Topics Concern  . None   Social History Narrative  . None   History reviewed. No pertinent family history. Allergies  Allergen Reactions  . Penicillins Rash   Prior to Admission medications   Not on File     Positive ROS: All other systems have been reviewed and were otherwise negative with the exception of those mentioned in the HPI and as above.  Physical Exam: General: Alert, no acute distress Cardiovascular: No pedal edema Respiratory: No cyanosis, no use of accessory musculature GI: abdomen soft Skin: No lesions in the area of chief complaint Neurologic: Sensation intact distally Psychiatric: Patient is competent for consent with normal mood and affect Lymphatic: no lymphedema  MUSCULOSKELETAL: exam stable  Assessment: bilateral knee degenerative joint disease  Plan: Plan for Procedure(s): TOTAL KNEE BILATERAL  The risks benefits and alternatives were discussed with the patient including but not limited to the risks of nonoperative treatment, versus surgical intervention including infection, bleeding, nerve injury,  blood clots, cardiopulmonary complications, morbidity, mortality, among  others, and they were willing to proceed.   Glee ArvinMichael Salle Brandle, MD   11/25/2015 6:54 AM

## 2015-11-25 NOTE — Progress Notes (Signed)
Patient right knee bleeding on ace wrap, as patient got up to chair with pt rt knee bleed more with some drops of blood on floor, Dr Roda ShuttersXu at bedside, and instructed to reinforce dressing, he will change it in the morning

## 2015-11-25 NOTE — Discharge Instructions (Signed)

## 2015-11-26 ENCOUNTER — Encounter (HOSPITAL_COMMUNITY): Payer: Self-pay | Admitting: Orthopaedic Surgery

## 2015-11-26 LAB — BASIC METABOLIC PANEL
ANION GAP: 3 — AB (ref 5–15)
BUN: 10 mg/dL (ref 6–20)
CALCIUM: 8 mg/dL — AB (ref 8.9–10.3)
CO2: 27 mmol/L (ref 22–32)
Chloride: 107 mmol/L (ref 101–111)
Creatinine, Ser: 0.88 mg/dL (ref 0.61–1.24)
Glucose, Bld: 197 mg/dL — ABNORMAL HIGH (ref 65–99)
POTASSIUM: 4.7 mmol/L (ref 3.5–5.1)
SODIUM: 137 mmol/L (ref 135–145)

## 2015-11-26 LAB — CBC
HCT: 33.6 % — ABNORMAL LOW (ref 39.0–52.0)
Hemoglobin: 11 g/dL — ABNORMAL LOW (ref 13.0–17.0)
MCH: 28.8 pg (ref 26.0–34.0)
MCHC: 32.7 g/dL (ref 30.0–36.0)
MCV: 88 fL (ref 78.0–100.0)
PLATELETS: 176 10*3/uL (ref 150–400)
RBC: 3.82 MIL/uL — AB (ref 4.22–5.81)
RDW: 13.1 % (ref 11.5–15.5)
WBC: 10.6 10*3/uL — AB (ref 4.0–10.5)

## 2015-11-26 NOTE — Progress Notes (Signed)
Orthopedic Tech Progress Note Patient Details:  Dewayne HatchDwayne R Brunson 12/26/1960 [id number] Ortho visit put  On cpm at 1850 Patient ID: Dewayne Hatchwayne R Robbs, male   DOB: 05/12/1960, 55 y.o.   MRN: [id number]   Jennye MoccasinHughes, Nusrat Encarnacion Craig 11/26/2015, 6:50 PM

## 2015-11-26 NOTE — Progress Notes (Signed)
Physical Therapy Treatment Patient Details Name: Daniel Rivas MRN: [id number] DOB: 10/01/1960 Today's Date: 11/26/2015    History of Present Illness Pt s/p elective bilat TKA.    PT Comments    Patient continues to progress gradually with mobility. Patient needs to practice stairs again next session.   Continue to progress as tolerated with anticipated d/c home with HHPT.   Follow Up Recommendations  Home health PT;Supervision/Assistance - 24 hour     Equipment Recommendations  Rolling walker with 5" wheels    Recommendations for Other Services       Precautions / Restrictions Precautions Precautions: Fall Precaution Comments: B TKA Restrictions Weight Bearing Restrictions: Yes RLE Weight Bearing: Weight bearing as tolerated LLE Weight Bearing: Weight bearing as tolerated    Mobility  Bed Mobility               General bed mobility comments: pt OOB in chair upon arrival  Transfers Overall transfer level: Needs assistance Equipment used: Rolling walker (2 wheeled) Transfers: Sit to/from Stand Sit to Stand: Min guard         General transfer comment: min guard for safety; carry over of safe hand placement and technique  Ambulation/Gait Ambulation/Gait assistance: Min guard Ambulation Distance (Feet): 150 Feet Assistive device: Rolling walker (2 wheeled) Gait Pattern/deviations: Step-through pattern;Decreased stride length;Trunk flexed;Antalgic Gait velocity: decreased   General Gait Details: cues for posture; pt with improved bilat heel strike    Stairs Stairs: Yes Stairs assistance: Mod assist Stair Management: No rails;Backwards;Step to pattern;With walker Number of Stairs: 1 General stair comments: cues for technique; assist to power up onto step and to stabilize RW; pt unable to ascend second step due to pain/fatigue  Wheelchair Mobility    Modified Rankin (Stroke Patients Only)       Balance             Standing balance-Leahy  Scale: Poor                      Cognition Arousal/Alertness: Awake/alert Behavior During Therapy: WFL for tasks assessed/performed Overall Cognitive Status: Within Functional Limits for tasks assessed                      Exercises      General Comments        Pertinent Vitals/Pain Pain Assessment: Faces Faces Pain Scale: Hurts little more Pain Location: bilat knees Pain Descriptors / Indicators: Aching;Sore Pain Intervention(s): Limited activity within patient's tolerance;Monitored during session;Premedicated before session;Repositioned    Home Living                      Prior Function            PT Goals (current goals can now be found in the care plan section) Acute Rehab PT Goals Patient Stated Goal: go home PT Goal Formulation: With patient Time For Goal Achievement: 12/02/15 Potential to Achieve Goals: Good Progress towards PT goals: Progressing toward goals    Frequency    7X/week      PT Plan Current plan remains appropriate    Co-evaluation             End of Session Equipment Utilized During Treatment: Gait belt Activity Tolerance: Patient tolerated treatment well Patient left: in chair;with call bell/phone within reach;with nursing/sitter in room;Other (comment) (bilat zero degree foam)     Time: 1547-1600 PT Time Calculation (min) (ACUTE ONLY): 13 min  Charges:  $  Gait Training: 8-22 mins                    G Codes:      Daniel Rivas, PTA Pager: [phone]   11/26/2015, 4:31 PM

## 2015-11-26 NOTE — Care Management Note (Signed)
Case Management Note  Patient Details  Name: Daniel HatchDwayne R Kilts MRN: [id number] Date of Birth: 06/21/1960  Subjective/Objective:  55 yr old gentleman s/p bilateral knee arthroplasties.                  Action/Plan: Case manager spoke with patient concerning discharge plan and DME needs. Patient was offered choice for Home Health Agency, referral was called to Ayesha RumpfMary Yonjof, [facility]. DME has been ordered.    Expected Discharge Date:   11/29/15               Expected Discharge Plan:  Home w Home Health Services  In-House Referral:  NA  Discharge planning Services  CM Consult  Post Acute Care Choice:  Home Health, Durable Medical Equipment Choice offered to:  Patient  DME Arranged:  3-N-1, Walker rolling, CPM DME Agency:  [employer]  HH Arranged:  PT [facility]  Status of Service:  Completed, signed off  If discussed at [employer] Length of Stay Meetings, dates discussed:    Additional Comments:  Durenda GuthrieBrady, Yamilee Harmes Naomi, RN 11/26/2015, 11:55 AM

## 2015-11-26 NOTE — Evaluation (Signed)
Occupational Therapy Evaluation Patient Details Name: Daniel HatchDwayne R Bendix MRN: [id number] DOB: 09/12/1960 Today's Date: 11/26/2015    History of Present Illness Pt s/p elective bilat TKA.   Clinical Impression   PTA, pt was independent with ADL and IADL, working as a Production designer, theatre/television/filmmanager, and driving. Pt currently requires mod assist with LB ADL, mod assist for toilet transfer and min assist for other functional mobility. Pt plans to D/C home with 24 hour assistance from wife. Recommend D/C home with assist from wife and no OT follow-up at this time. Pt would benefit from continued OT services while admitted to improve independence with ADL to ensure safe D/C home.  OT will continue to follow acutely.    Follow Up Recommendations  No OT follow up;Supervision/Assistance - 24 hour    Equipment Recommendations  3 in 1 bedside comode;Tub/shower bench       Precautions / Restrictions Precautions Precautions: Fall;Knee Precaution Booklet Issued: No Precaution Comments: B TKA Restrictions Weight Bearing Restrictions: Yes RLE Weight Bearing: Weight bearing as tolerated LLE Weight Bearing: Weight bearing as tolerated      Mobility Bed Mobility Overal bed mobility: Needs Assistance Bed Mobility: Supine to Sit     Supine to sit: Min guard;HOB elevated     General bed mobility comments: used bed rail  Transfers Overall transfer level: Needs assistance Equipment used: Rolling walker (2 wheeled) Transfers: Sit to/from Stand Sit to Stand: Mod assist         General transfer comment: Able to complete sit<>stand with mod assist this session.    Balance Overall balance assessment: Needs assistance Sitting-balance support: No upper extremity supported;Feet supported Sitting balance-Leahy Scale: Good     Standing balance support: Bilateral upper extremity supported;During functional activity Standing balance-Leahy Scale: Poor Standing balance comment: Able to stand at sink to brush teeth but  maintained B elbows supported on counter and leaning on sink.                            ADL Overall ADL's : Needs assistance/impaired     Grooming: Oral care;Standing;Minimal assistance;Min guard Grooming Details (indicate cue type and reason): Able to stand at sink with elbows supported to brush teeth with min assist initially and progressing to min guard. Upper Body Bathing: Set up;Sitting   Lower Body Bathing: Moderate assistance   Upper Body Dressing : Set up;Sitting   Lower Body Dressing: Moderate assistance;Sit to/from stand   Toilet Transfer: Ambulation;BSC;RW;Moderate assistance;Minimal assistance Toilet Transfer Details (indicate cue type and reason): Mod assist sit<>stand, min assist ambulation Toileting- Clothing Manipulation and Hygiene: Minimal assistance;Sit to/from stand Toileting - Clothing Manipulation Details (indicate cue type and reason): Mod assist for sit<>stand     Functional mobility during ADLs: Minimal assistance;Rolling walker;Moderate assistance (Mod assist sit<>stand, min assist once standing/ambulating) General ADL Comments: Educated pt on knee precautions during ADL, fall prevention strategies, use of ice for pain management, and safe ADL post-operatively.     Vision Vision Assessment?: No apparent visual deficits          Pertinent Vitals/Pain Pain Assessment: Faces Faces Pain Scale: Hurts little more Pain Location: B knees Pain Descriptors / Indicators: Aching     Hand Dominance Right   Extremity/Trunk Assessment Upper Extremity Assessment Upper Extremity Assessment: Overall WFL for tasks assessed   Lower Extremity Assessment Lower Extremity Assessment: RLE deficits/detail;LLE deficits/detail RLE Deficits / Details: Decreased strength and ROM as expected post-operatively. LLE Deficits / Details: Decreased strength and  ROM as expected post-operatively.       Communication Communication Communication: No difficulties    Cognition Arousal/Alertness: Awake/alert Behavior During Therapy: WFL for tasks assessed/performed Overall Cognitive Status: Within Functional Limits for tasks assessed                                Home Living Family/patient expects to be discharged to:: Private residence Living Arrangements: Spouse/significant other Available Help at Discharge: Family;Available 24 hours/day Type of Home: House Home Access: Stairs to enter [employer] CorporationEntrance Stairs-Number of Steps: 2 Entrance Stairs-Rails: None Home Layout: Two level;Able to live on main level with bedroom/bathroom     Bathroom Shower/Tub: Tub/shower unit Shower/tub characteristics: Engineer, building servicesCurtain Bathroom Toilet: Standard     Home Equipment: Cane - single point;Shower seat          Prior Functioning/Environment Level of Independence: Independent        Comments: Working         OT Problem List: Decreased strength;Decreased range of motion;Decreased activity tolerance;Impaired balance (sitting and/or standing);Decreased knowledge of use of DME or AE;Pain   OT Treatment/Interventions: Self-care/ADL training;Therapeutic exercise;Therapeutic activities;Patient/family education;Balance training;DME and/or AE instruction    OT Goals(Current goals can be found in the care plan section) Acute Rehab OT Goals Patient Stated Goal: go home and get back to life OT Goal Formulation: With patient Time For Goal Achievement: 12/10/15 Potential to Achieve Goals: Good ADL Goals Pt Will Perform Lower Body Bathing: sit to/from stand;with modified independence;with adaptive equipment Pt Will Perform Lower Body Dressing: sit to/from stand;with modified independence;with adaptive equipment Pt Will Transfer to Toilet: bedside commode;ambulating;with supervision Pt Will Perform Toileting - Clothing Manipulation and hygiene: with modified independence;sit to/from stand Pt Will Perform Tub/Shower Transfer: 3 in 1;rolling walker;ambulating;Tub  transfer;with min assist;tub bench  OT Frequency: Min 2X/week    End of Session Equipment Utilized During Treatment: Gait belt;Rolling walker CPM Left Knee CPM Left Knee: On Left Knee Flexion (Degrees): 60 Left Knee Extension (Degrees): 0 CPM Right Knee CPM Right Knee: On Right Knee Flexion (Degrees): 60 Right Knee Extension (Degrees): 0 Nurse Communication: Mobility status  Activity Tolerance: Patient tolerated treatment well Patient left: in chair;with call bell/phone within reach   Time: [id number] OT Time Calculation (min): 24 min Charges:  OT General Charges $OT Visit: 1 Procedure OT Evaluation $OT Eval Moderate Complexity: 1 Procedure OT Treatments $Self Care/Home Management : 8-22 mins  Doristine SectionCharity A Sameerah Nachtigal, OTR/L [phone] 11/26/2015, 10:07 AM

## 2015-11-26 NOTE — Progress Notes (Signed)
Physical Therapy Treatment Patient Details Name: Daniel HatchDwayne Rivas Rivas MRN: [id number] DOB: 11/02/1960 Today's Date: 11/26/2015    History of Present Illness Pt s/p elective bilat TKA.    PT Comments    Patient is progressing well toward mobility goals. Tolerated increased gait distance and therex well this session. Continue to progress as tolerated with anticipated d/c home with HHPT.   Follow Up Recommendations  Home health PT;Supervision/Assistance - 24 hour     Equipment Recommendations  Rolling walker with 5" wheels    Recommendations for Other Services       Precautions / Restrictions Precautions Precautions: Fall Precaution Booklet Issued: No Precaution Comments: B TKA Restrictions Weight Bearing Restrictions: Yes RLE Weight Bearing: Weight bearing as tolerated LLE Weight Bearing: Weight bearing as tolerated    Mobility  Bed Mobility Overal bed mobility: Needs Assistance Bed Mobility: Supine to Sit     Supine to sit: Min guard;HOB elevated     General bed mobility comments: pt OOB in chair upon arrival  Transfers Overall transfer level: Needs assistance Equipment used: Rolling walker (2 wheeled) Transfers: Sit to/from Stand Sit to Stand: Min guard         General transfer comment: min guard for safety from recliner and BSC; cues for hand placement and technique  Ambulation/Gait Ambulation/Gait assistance: Min guard Ambulation Distance (Feet): 150 Feet Assistive device: Rolling walker (2 wheeled) Gait Pattern/deviations: Step-through pattern;Decreased stride length;Trunk flexed Gait velocity: decreased   General Gait Details: pt with slow, steady gait and no knee instability noted; several very brief standing rest periods due to c/o UE fatigue; cues for bilat heel strike and posture; heavy reliance on RW   Stairs            Wheelchair Mobility    Modified Rankin (Stroke Patients Only)       Balance Overall balance assessment: Needs  assistance Sitting-balance support: No upper extremity supported;Feet supported Sitting balance-Leahy Scale: Good     Standing balance support: Bilateral upper extremity supported;During functional activity Standing balance-Leahy Scale: Poor Standing balance comment: Able to stand at sink to brush teeth but maintained B elbows supported on counter and leaning on sink.                    Cognition Arousal/Alertness: Awake/alert Behavior During Therapy: WFL for tasks assessed/performed Overall Cognitive Status: Within Functional Limits for tasks assessed                      Exercises Total Joint Exercises Ankle Circles/Pumps: AROM;Both;10 reps Quad Sets: AROM;Both;10 reps Heel Slides: AAROM;Both;10 reps Straight Leg Raises: AROM;Both;5 reps Goniometric ROM: 13-64 Rivas knee; 10-65 L knee    General Comments        Pertinent Vitals/Pain Pain Assessment: Faces Faces Pain Scale: Hurts little more Pain Location: bilat knees Pain Descriptors / Indicators: Aching;Guarding;Sore Pain Intervention(s): Limited activity within patient's tolerance;Monitored during session;Repositioned;RN gave pain meds during session;Ice applied    Home Living Family/patient expects to be discharged to:: Private residence Living Arrangements: Spouse/significant other Available Help at Discharge: Family;Available 24 hours/day Type of Home: House Home Access: Stairs to enter Entrance Stairs-Rails: None Home Layout: Two level;Able to live on main level with bedroom/bathroom Home Equipment: Gilmer MorCane - single point;Shower seat      Prior Function Level of Independence: Independent      Comments: Working    PT Goals (current goals can now be found in the care plan section) Acute Rehab PT Goals Patient  Stated Goal: go home PT Goal Formulation: With patient Time For Goal Achievement: 12/02/15 Potential to Achieve Goals: Good Progress towards PT goals: Progressing toward goals     Frequency    7X/week      PT Plan Current plan remains appropriate    Co-evaluation             End of Session Equipment Utilized During Treatment: Gait belt Activity Tolerance: Patient tolerated treatment well Patient left: in chair;with call bell/phone within reach;with nursing/sitter in room     Time: 0930-1010 PT Time Calculation (min) (ACUTE ONLY): 40 min  Charges:  $Gait Training: 8-22 mins $Therapeutic Exercise: 8-22 mins $Therapeutic Activity: 8-22 mins                    G Codes:      Daniel MoundKellyn Rivas Daniel Rivas Daniel Rivas, PTA Pager: [phone]   11/26/2015, 10:26 AM

## 2015-11-26 NOTE — Progress Notes (Signed)
Orthopedic Tech Progress Note Patient Details:  Daniel Rivas 07/07/1960 [id number]  Ortho Devices Ortho Device/Splint Location: foot roll   [name] 11/26/2015, 4:02 PM

## 2015-11-26 NOTE — Progress Notes (Signed)
Orthopedic Tech Progress Note Patient Details:  Daniel Rivas 08/06/1960 [id number]  Patient ID: Daniel HatchDwayne R Rivas, male   DOB: 03/14/1960, 55 y.o.   MRN: [id number]  Applied cpms bi 0-60 Trinna PostMartinez, Erricka Falkner J 11/26/2015, 6:48 AM

## 2015-11-26 NOTE — Progress Notes (Signed)
Patient has home unit CPAP. Wife is at beside and is going to help him put it on. No assistance needed. No O2 bleed in needed.

## 2015-11-26 NOTE — Progress Notes (Signed)
Patient has brought in home CPAP with mask and tubing. CPAP is plugged in, ready to go. Patient will place himself on when ready. RT will continue to monitor.

## 2015-11-26 NOTE — Progress Notes (Signed)
   Subjective:  Patient reports pain as mild.  No events.  Objective:   VITALS:   Vitals:   11/25/15 1451 11/25/15 1939 11/25/15 2300 11/26/15 0550  BP: 105/60 (!) 111/49 106/65 111/63  Pulse: 70 87 84 88  Resp: 18 18 18 18   Temp: 98.9 F (37.2 C) 99.4 F (37.4 C) 99 F (37.2 C) 98.8 F (37.1 C)  TempSrc:  Oral Oral Oral  SpO2: 97% 97% 97% (!) 89%  Weight:      Height:        Neurologically intact ABD soft Neurovascular intact Sensation intact distally Intact pulses distally Dorsiflexion/Plantar flexion intact Incision: dressing C/D/I and no drainage No cellulitis present Compartment soft   Lab Results  Component Value Date   WBC 10.6 (H) 11/26/2015   HGB 11.0 (L) 11/26/2015   HCT 33.6 (L) 11/26/2015   MCV 88.0 11/26/2015   PLT 176 11/26/2015     Assessment/Plan:  1 Day Post-Op   - Expected postop acute blood loss anemia - will monitor for symptoms - Up with PT/OT - DVT ppx - SCDs, ambulation, lovenox - WBAT operative extremities - Pain control - Discharge planning - patient wants to go home tomorrow vs thursday  Glee ArvinMichael Charmine Bockrath 11/26/2015, 8:19 AM [phone]

## 2015-11-27 LAB — CBC
HCT: 33.1 % — ABNORMAL LOW (ref 39.0–52.0)
HEMOGLOBIN: 11 g/dL — AB (ref 13.0–17.0)
MCH: 28.7 pg (ref 26.0–34.0)
MCHC: 33.2 g/dL (ref 30.0–36.0)
MCV: 86.4 fL (ref 78.0–100.0)
PLATELETS: 182 10*3/uL (ref 150–400)
RBC: 3.83 MIL/uL — AB (ref 4.22–5.81)
RDW: 13.1 % (ref 11.5–15.5)
WBC: 19.6 10*3/uL — ABNORMAL HIGH (ref 4.0–10.5)

## 2015-11-27 NOTE — Discharge Summary (Signed)
Physician Discharge Summary      Patient ID: ROSIE GOLSON MRN: [id number] DOB/AGE: 1960/03/09 55 y.o.  Admit date: 11/25/2015 Discharge date: 11/27/2015  Admission Diagnoses:  <principal problem not specified>  Discharge Diagnoses:  Active Problems:   Bilateral primary osteoarthritis of knee   Total knee replacement status   Past Medical History:  Diagnosis Date  . Arthritis   . Sleep apnea    cpap    Surgeries: Procedure(s): TOTAL KNEE BILATERAL on 11/25/2015   Consultants (if any):   Discharged Condition: Improved  Hospital Course: ZEDRIC DEROY is an 55 y.o. male who was admitted 11/25/2015 with a diagnosis of <principal problem not specified> and went to the operating room on 11/25/2015 and underwent the above named procedures.    He was given perioperative antibiotics:  Anti-infectives    Start     Dose/Rate Route Frequency Ordered Stop   11/25/15 1800  ceFAZolin (ANCEF) IVPB 2g/100 mL premix     2 g 200 mL/hr over 30 Minutes Intravenous Every 6 hours 11/25/15 1436 11/25/15 2331   11/25/15 0705  ceFAZolin (ANCEF) 2-4 GM/100ML-% IVPB    Comments:  Bronson Ing   : cabinet override      11/25/15 0705 11/25/15 0757   11/25/15 0600  ceFAZolin (ANCEF) 3 g in dextrose 5 % 50 mL IVPB  Status:  Discontinued     3 g 130 mL/hr over 30 Minutes Intravenous On call to O.R. 11/24/15 1649 11/24/15 2336   11/25/15 0600  ceFAZolin (ANCEF) IVPB 2g/100 mL premix     2 g 200 mL/hr over 30 Minutes Intravenous  Once 11/24/15 2337 11/25/15 1221    .  He was given sequential compression devices, early ambulation, and lovenox for DVT prophylaxis.  He benefited maximally from the hospital stay and there were no complications.    Recent vital signs:  Vitals:   11/26/15 2100 11/27/15 0330  BP: (!) 157/81 (!) 146/67  Pulse: (!) 118 (!) 113  Resp: 16 16  Temp: (!) 101.5 F (38.6 C) 98.8 F (37.1 C)    Recent laboratory studies:  Lab Results  Component Value Date   HGB 11.0 (L) 11/27/2015   HGB 11.0 (L) 11/26/2015   HGB 12.4 (L) 11/25/2015   Lab Results  Component Value Date   WBC 19.6 (H) 11/27/2015   PLT 182 11/27/2015   Lab Results  Component Value Date   INR 0.96 11/18/2015   Lab Results  Component Value Date   NA 137 11/26/2015   K 4.7 11/26/2015   CL 107 11/26/2015   CO2 27 11/26/2015   BUN 10 11/26/2015   CREATININE 0.88 11/26/2015   GLUCOSE 197 (H) 11/26/2015    Discharge Medications:     Medication List    TAKE these medications   enoxaparin 40 MG/0.4ML injection Commonly known as:  LOVENOX Inject 0.4 mLs (40 mg total) into the skin daily.   methocarbamol 750 MG tablet Commonly known as:  ROBAXIN Take 1 tablet (750 mg total) by mouth 2 (two) times daily as needed for muscle spasms.   ondansetron 4 MG tablet Commonly known as:  ZOFRAN Take 1-2 tablets (4-8 mg total) by mouth every 8 (eight) hours as needed for nausea or vomiting.   oxyCODONE 10 mg 12 hr tablet Commonly known as:  OXYCONTIN Take 1 tablet (10 mg total) by mouth every 12 (twelve) hours.   oxyCODONE 5 MG immediate release tablet Commonly known as:  Oxy IR/ROXICODONE Take 1-3 tablets (5-15  mg total) by mouth every 4 (four) hours as needed.   promethazine 25 MG tablet Commonly known as:  PHENERGAN Take 1 tablet (25 mg total) by mouth every 6 (six) hours as needed for nausea.   senna-docusate 8.6-50 MG tablet Commonly known as:  SENOKOT S Take 1 tablet by mouth at bedtime as needed.            Durable Medical Equipment        Start     Ordered   11/26/15 1040  For Home Use Only DME CPM  Once    Question Answer Comment  Laterality Left Knee   Starting Flexion 0-90   Ending Flexion 90-120   Increase by Daily 5-10   Surgery Date 11/25/2015   CPM Started 11/25/2015      11/26/15 1041   11/26/15 1040  For Home Use Only DME CPM  Once    Question Answer Comment  Laterality Right Knee   Starting Flexion 0-90   Ending Flexion 90-120     Increase by Daily 5-10   Surgery Date 11/25/2015   CPM Started 11/25/2015      11/26/15 1041   11/25/15 1437  DME Walker rolling  Once     11/25/15 1436   11/25/15 1437  DME 3 n 1  Once     11/25/15 1436   11/25/15 1437  DME Bedside commode  Once     11/25/15 1436      Diagnostic Studies: Dg Knee Left Port  Result Date: 11/25/2015 CLINICAL DATA:  Status post total left knee arthroplasty. EXAM: PORTABLE LEFT KNEE - 1-2 VIEW COMPARISON:  None. FINDINGS: The femoral and tibial components are well seated. No complicating features are demonstrated. Expected postoperative changes. IMPRESSION: Well seated components of a total knee arthroplasty. No complicating features. Electronically Signed   By: Rudie MeyerP.  Gallerani M.D.   On: 11/25/2015 13:23   Dg Knee Right Port  Result Date: 11/25/2015 CLINICAL DATA:  Postop right knee arthroplasty. EXAM: PORTABLE RIGHT KNEE - 1-2 VIEW COMPARISON:  None. FINDINGS: Right knee prosthetic components appear well seated and well aligned. There is no acute fracture or evidence of an operative complication. IMPRESSION: Well-positioned right knee arthroplasty. Electronically Signed   By: Amie Portlandavid  Ormond M.D.   On: 11/25/2015 13:04    Disposition: Final discharge disposition not confirmed  Discharge Instructions    Call MD / Call 911    Complete by:  As directed    If you experience chest pain or shortness of breath, CALL 911 and be transported to the hospital emergency room.  If you develope a fever above 101.5 F, pus (white drainage) or increased drainage or redness at the wound, or calf pain, call your surgeon's office.   Constipation Prevention    Complete by:  As directed    Drink plenty of fluids.  Prune juice may be helpful.  You may use a stool softener, such as Colace (over the counter) 100 mg twice a day.  Use MiraLax (over the counter) for constipation as needed.   Diet general    Complete by:  As directed    Driving restrictions    Complete by:  As  directed    No driving while taking narcotic pain meds.   Increase activity slowly as tolerated    Complete by:  As directed       Follow-up Information    Glee ArvinMichael Xu, MD Follow up in 2 week(s).   Specialty:  Orthopedic Surgery Why:  For suture removal, For wound re-check Contact information: [address] [place] [address] [phone]        [facility] Follow up.   Specialty:  Home Health Services Why:  Someone from [facility] will contact you to arrange start date and time for therapy. Contact information: [address] [place] [address] [place] [address] [phone]            Signed: Glee ArvinMichael Xu 11/27/2015, 1:47 PM

## 2015-11-27 NOTE — Progress Notes (Signed)
   Subjective:  Patient reports pain as mild.  No events.  Objective:   VITALS:   Vitals:   11/26/15 0550 11/26/15 1500 11/26/15 2100 11/27/15 0330  BP: 111/63 (!) 152/76 (!) 157/81 (!) 146/67  Pulse: 88 95 (!) 118 (!) 113  Resp: 18 16 16 16   Temp: 98.8 F (37.1 C) 99.9 F (37.7 C) (!) 101.5 F (38.6 C) 98.8 F (37.1 C)  TempSrc: Oral Oral Oral Oral  SpO2: (!) 89% 97% 96% 99%  Weight:      Height:        Neurologically intact ABD soft Neurovascular intact Sensation intact distally Intact pulses distally Dorsiflexion/Plantar flexion intact Incision: dressing C/D/I and no drainage No cellulitis present Compartment soft   Lab Results  Component Value Date   WBC 19.6 (H) 11/27/2015   HGB 11.0 (L) 11/27/2015   HCT 33.1 (L) 11/27/2015   MCV 86.4 11/27/2015   PLT 182 11/27/2015     Assessment/Plan:  2 Days Post-Op   - tolerating CPMs - PT for stair training today - likely home this pm - I will check back with patient this afternoon  Glee ArvinMichael Xu 11/27/2015, 8:00 AM [phone]

## 2015-11-27 NOTE — Progress Notes (Signed)
Occupational Therapy Treatment/Discharge Patient Details Name: Daniel Rivas MRN: [id number] DOB: [birth year] Today's Date: 11/27/2015    History of present illness Pt s/p elective bilat TKA.   OT comments  Pt progressing well toward OT goals. Pt able to complete tub transfer with tub bench this session with min guard assist and LB dressing with AE with min assist this session. All education complete concerning ADL post-operatively with knee precautions, fall prevention, AE use, energy conservation, and safe use of DME. Pt demonstrates understanding and reports no further questions. Pt plans to D/C home this afternoon with wife providing 24 hour assistance. D/C plan remains appropriate. Pt has all DME needs met. OT will sign off.   Follow Up Recommendations  No OT follow up;Supervision/Assistance - 24 hour    Equipment Recommendations  3 in 1 bedside comode;Tub/shower bench    Recommendations for Other Services      Precautions / Restrictions Precautions Precautions: Fall Precaution Comments: B TKA Restrictions Weight Bearing Restrictions: Yes RLE Weight Bearing: Weight bearing as tolerated LLE Weight Bearing: Weight bearing as tolerated       Mobility Bed Mobility               General bed mobility comments: Received in recliner  Transfers Overall transfer level: Needs assistance Equipment used: Rolling walker (2 wheeled) Transfers: Sit to/from Stand Sit to Stand: Min guard              Balance Overall balance assessment: Needs assistance Sitting-balance support: No upper extremity supported;Feet supported Sitting balance-Leahy Scale: Good     Standing balance support: During functional activity;No upper extremity supported;Bilateral upper extremity supported Standing balance-Leahy Scale: Fair Standing balance comment: Requires B UE support for ambulation. Able to stand with no UE support briefly with min guard assist to pull up pants.                    ADL Overall ADL's : Needs assistance/impaired                     Lower Body Dressing: Minimal assistance;Sit to/from stand;With adaptive equipment   Toilet Transfer: BSC;Ambulation;Min guard       Tub/ Banker: Ambulation;Tub bench;Min guard;Rolling walker   Functional mobility during ADLs: Min guard;Rolling walker        Vision                     Perception     Praxis      Cognition   Behavior During Therapy: WFL for tasks assessed/performed Overall Cognitive Status: Within Functional Limits for tasks assessed                       Extremity/Trunk Assessment               Exercises     Shoulder Instructions       General Comments      Pertinent Vitals/ Pain       Pain Assessment: Faces Faces Pain Scale: Hurts a little bit Pain Location: B knees Pain Descriptors / Indicators: Sore Pain Intervention(s): Limited activity within patient's tolerance;Monitored during session;Ice applied;Repositioned  Home Living                                          Prior Functioning/Environment  Frequency  Min 2X/week        Progress Toward Goals  OT Goals(current goals can now be found in the care plan section)  Progress towards OT goals: Progressing toward goals  Acute Rehab OT Goals Patient Stated Goal: go home OT Goal Formulation: With patient Time For Goal Achievement: 12/10/15 Potential to Achieve Goals: Good ADL Goals Pt Will Perform Lower Body Bathing: sit to/from stand;with modified independence;with adaptive equipment Pt Will Perform Lower Body Dressing: sit to/from stand;with modified independence;with adaptive equipment Pt Will Transfer to Toilet: bedside commode;ambulating;with supervision Pt Will Perform Toileting - Clothing Manipulation and hygiene: with modified independence;sit to/from stand Pt Will Perform Tub/Shower Transfer: 3 in 1;rolling walker;ambulating;Tub  transfer;with min assist;tub bench  Plan Discharge plan remains appropriate    Co-evaluation                 End of Session Equipment Utilized During Treatment: Gait belt;Rolling walker   Activity Tolerance Patient tolerated treatment well   Patient Left in chair;with call bell/phone within reach   Nurse Communication Mobility status        Time: 7591-6384 OT Time Calculation (min): 33 min  Charges: OT General Charges $OT Visit: 1 Procedure OT Treatments $Self Care/Home Management : 23-37 mins  Norman Herrlich, OTR/L [phone] 11/27/2015, 3:37 PM

## 2015-11-27 NOTE — Progress Notes (Signed)
Orthopedic Tech Progress Note Patient Details:  Dewayne HatchDwayne R Chiaramonte 06/14/1960 [id number]  Patient ID: Dewayne HatchDwayne R Bokhari, male   DOB: 07/24/1960, 55 y.o.   MRN: [id number] appliee bi-cpms 0-60  Trinna PostMartinez, Parris Cudworth J 11/27/2015, 6:11 AM

## 2015-11-27 NOTE — Progress Notes (Signed)
Physical Therapy Treatment Patient Details Name: Daniel HatchDwayne R Rivas MRN: [id number] DOB: 05/27/1960 Today's Date: 11/27/2015    History of Present Illness Pt s/p elective bilat TKA.    PT Comments    Patient continues to progress well with mobility and tolerated increased gait distance and stair training this am. HEP this pm. Current plan remains appropriate.   Follow Up Recommendations  Home health PT;Supervision/Assistance - 24 hour     Equipment Recommendations  Rolling walker with 5" wheels    Recommendations for Other Services       Precautions / Restrictions Precautions Precautions: Fall Precaution Comments: B TKA Restrictions Weight Bearing Restrictions: Yes RLE Weight Bearing: Weight bearing as tolerated LLE Weight Bearing: Weight bearing as tolerated    Mobility  Bed Mobility               General bed mobility comments: pt OOB in chair upon arrival  Transfers Overall transfer level: Needs assistance Equipment used: Rolling walker (2 wheeled) Transfers: Sit to/from Stand Sit to Stand: Supervision         General transfer comment: carry over of safe hand placement  Ambulation/Gait Ambulation/Gait assistance: Supervision Ambulation Distance (Feet): 300 Feet Assistive device: Rolling walker (2 wheeled) Gait Pattern/deviations: Step-through pattern;Trunk flexed;Decreased stride length Gait velocity: decreased   General Gait Details: pt with improved posture and bilat heel strike with less reliance on UE support; cues for bilat knee extension during stance phase and posture   Stairs   Stairs assistance: Min guard Stair Management: Two rails;Step to pattern;Forwards Number of Stairs: 2 General stair comments: cues for safety and technique; pt reported bilat rails on front steps however discussed use of w/c and given handout if stair management too difficult or if pt needs to enter back of house instead  Wheelchair Mobility    Modified Rankin  (Stroke Patients Only)       Balance     Sitting balance-Leahy Scale: Good       Standing balance-Leahy Scale: Fair                      Cognition Arousal/Alertness: Awake/alert Behavior During Therapy: WFL for tasks assessed/performed Overall Cognitive Status: Within Functional Limits for tasks assessed                      Exercises      General Comments        Pertinent Vitals/Pain Pain Assessment: Faces Faces Pain Scale: Hurts little more Pain Location: bilat knees Pain Descriptors / Indicators: Sore Pain Intervention(s): Limited activity within patient's tolerance;Monitored during session;Premedicated before session;Repositioned    Home Living                      Prior Function            PT Goals (current goals can now be found in the care plan section) Acute Rehab PT Goals Patient Stated Goal: go home PT Goal Formulation: With patient Time For Goal Achievement: 12/02/15 Potential to Achieve Goals: Good Progress towards PT goals: Progressing toward goals    Frequency    7X/week      PT Plan Current plan remains appropriate    Co-evaluation             End of Session Equipment Utilized During Treatment: Gait belt Activity Tolerance: Patient tolerated treatment well Patient left: in chair;with call bell/phone within reach;Other (comment) (bilat zero degree foam)  Time: [id number] PT Time Calculation (min) (ACUTE ONLY): 23 min  Charges:  $Gait Training: 23-37 mins                    G Codes:      Daniel Rivas, PTA Pager: [phone]   11/27/2015, 11:30 AM

## 2015-11-27 NOTE — Progress Notes (Signed)
Pt ready for discharge. Education/instructions reviewed with pt and all questions/concerns addressed. Belongings gathered and pt will be transported out via wheelchair to wife's vehicle. Will continue to monitor

## 2015-11-29 ENCOUNTER — Telehealth (INDEPENDENT_AMBULATORY_CARE_PROVIDER_SITE_OTHER): Payer: Self-pay | Admitting: Orthopaedic Surgery

## 2015-11-29 NOTE — Telephone Encounter (Signed)
yes

## 2015-11-29 NOTE — Telephone Encounter (Signed)
See message below °

## 2015-11-29 NOTE — Telephone Encounter (Signed)
[facility] called requesting post-op Physical Therapy orders from Dr. Roda ShuttersXu for Mr. Alto DenverHunt. She's requesting "2 weeks 1" and "3 weeks 2" Due to his having a "bilateral total knee, it'll take longer to get him out of the house." Please give Randa EvensJoanne a call regarding this.  Joanne's ph# [phone] Thank you.

## 2015-12-02 ENCOUNTER — Telehealth (INDEPENDENT_AMBULATORY_CARE_PROVIDER_SITE_OTHER): Payer: Self-pay | Admitting: Orthopaedic Surgery

## 2015-12-02 NOTE — Telephone Encounter (Signed)
Patient is confused about how he should take his post-operative pain medication.  He would like you to call him at [phone].

## 2015-12-02 NOTE — Telephone Encounter (Signed)
Left message on Voicemail Dr  Roda ShuttersXu approved on orders

## 2015-12-02 NOTE — Telephone Encounter (Signed)
Give him a call if you can

## 2015-12-03 NOTE — Telephone Encounter (Signed)
Called patient back to advise per Dr Roda ShuttersXu once he finishes oxycontin he should take oxycodone. Patient aware

## 2015-12-04 ENCOUNTER — Telehealth (INDEPENDENT_AMBULATORY_CARE_PROVIDER_SITE_OTHER): Payer: Self-pay

## 2015-12-04 NOTE — Telephone Encounter (Signed)
Home health called and wanted verbal orders on pt to change dressing. Dr Lajoyce Cornersuda gave verbal orders to nurse.

## 2015-12-09 ENCOUNTER — Ambulatory Visit (INDEPENDENT_AMBULATORY_CARE_PROVIDER_SITE_OTHER): Payer: Self-pay

## 2015-12-09 ENCOUNTER — Ambulatory Visit (INDEPENDENT_AMBULATORY_CARE_PROVIDER_SITE_OTHER): Payer: BLUE CROSS/BLUE SHIELD | Admitting: Orthopaedic Surgery

## 2015-12-09 ENCOUNTER — Encounter (INDEPENDENT_AMBULATORY_CARE_PROVIDER_SITE_OTHER): Payer: Self-pay | Admitting: Orthopaedic Surgery

## 2015-12-09 DIAGNOSIS — M1712 Unilateral primary osteoarthritis, left knee: Secondary | ICD-10-CM

## 2015-12-09 DIAGNOSIS — M170 Bilateral primary osteoarthritis of knee: Secondary | ICD-10-CM

## 2015-12-09 DIAGNOSIS — M1711 Unilateral primary osteoarthritis, right knee: Secondary | ICD-10-CM | POA: Diagnosis not present

## 2015-12-09 DIAGNOSIS — Z96653 Presence of artificial knee joint, bilateral: Secondary | ICD-10-CM

## 2015-12-09 MED ORDER — HYDROCODONE-ACETAMINOPHEN 5-325 MG PO TABS: 1.0000 | ORAL_TABLET | Freq: Four times a day (QID) | ORAL | 0 refills | Status: AC | PRN

## 2015-12-09 MED ORDER — ASPIRIN EC 325 MG PO TBEC: 325.0000 mg | DELAYED_RELEASE_TABLET | Freq: Two times a day (BID) | ORAL | 0 refills | Status: AC

## 2015-12-09 MED ORDER — ZOLPIDEM TARTRATE 5 MG PO TABS: 5.0000 mg | ORAL_TABLET | Freq: Every evening | ORAL | 0 refills | Status: AC | PRN

## 2015-12-09 NOTE — Progress Notes (Signed)
Office Visit Note   Patient: Daniel Rivas           Date of Birth: 08/07/1960           MRN: [id number] Visit Date: 12/09/2015              Requested by: No referring provider defined for this encounter. PCP: [facility],JAMES   Assessment & Plan: Visit Diagnoses:  1. Bilateral primary osteoarthritis of knee   2. Status post total bilateral knee replacement     Plan: Begin outpatient physical therapy next week. May begin to go back to work it 20 hours a week. Hydrocodone prescribed. Aspirin for DVT prophylaxis. Patient is doing extremely well. He may drive in 2 weeks. I will see him back in 4 weeks for clinical recheck  Follow-Up Instructions: No Follow-up on file.   Orders:  Orders Placed This Encounter  Procedures  . XR Knee 1-2 Views Right  . XR Knee 1-2 Views Left   Meds ordered this encounter  Medications  . aspirin EC 325 MG tablet    Sig: Take 1 tablet (325 mg total) by mouth 2 (two) times daily.    Dispense:  84 tablet    Refill:  0  . zolpidem (AMBIEN) 5 MG tablet    Sig: Take 1 tablet (5 mg total) by mouth at bedtime as needed for sleep.    Dispense:  30 tablet    Refill:  0  . HYDROcodone-acetaminophen (NORCO) 5-325 MG tablet    Sig: Take 1 tablet by mouth every 6 (six) hours as needed.    Dispense:  30 tablet    Refill:  0      Procedures: No procedures performed   Clinical Data: No additional findings.   Subjective: Chief Complaint  Patient presents with  . Right Knee - Routine Post Op, Pain  . Left Knee - Routine Post Op, Pain    HPI The patient is 2 weeks status post bilateral total knee replacements. He is progressing well with physical therapy. He is taking minimal pain medicines. Review of Systems   Objective: Vital Signs: There were no vitals taken for this visit.  Physical Exam  Ortho Exam Bilateral knee exam shows well-healed surgical incisions. This Staples are intact. Calf is nontender. Normal expected swelling. Specialty  Comments:  No specialty comments available.  Imaging: Xr Knee 1-2 Views Left  Result Date: 12/09/2015 Stable left total knee replacement  Xr Knee 1-2 Views Right  Result Date: 12/09/2015 Stable right total knee replacement    PMFS History: Patient Active Problem List   Diagnosis Date Noted  . Bilateral primary osteoarthritis of knee 11/25/2015  . Total knee replacement status 11/25/2015   Past Medical History:  Diagnosis Date  . Arthritis   . Sleep apnea    cpap    No family history on file.  Past Surgical History:  Procedure Laterality Date  . ANTERIOR CRUCIATE LIGAMENT REPAIR     bil  . TOTAL KNEE ARTHROPLASTY Bilateral 11/25/2015   Procedure: TOTAL KNEE BILATERAL;  Surgeon: Tarry KosNaiping M Xu, MD;  Location: [facility] OR;  Service: Orthopedics;  Laterality: Bilateral;   Social History   Occupational History  . Not on file.   Social History Main Topics  . Smoking status: Current Some Day Smoker    Types: Cigars  . Smokeless tobacco: Never Used  . Alcohol use Yes     Comment: weekly  . Drug use: No  . Sexual activity: Not on file

## 2016-01-02 ENCOUNTER — Ambulatory Visit (INDEPENDENT_AMBULATORY_CARE_PROVIDER_SITE_OTHER): Payer: BLUE CROSS/BLUE SHIELD | Admitting: Orthopaedic Surgery

## 2016-01-02 ENCOUNTER — Encounter (INDEPENDENT_AMBULATORY_CARE_PROVIDER_SITE_OTHER): Payer: Self-pay | Admitting: Orthopaedic Surgery

## 2016-01-02 DIAGNOSIS — Z96653 Presence of artificial knee joint, bilateral: Secondary | ICD-10-CM

## 2016-01-02 DIAGNOSIS — M170 Bilateral primary osteoarthritis of knee: Secondary | ICD-10-CM

## 2016-01-02 NOTE — Progress Notes (Signed)
Patient ID: Dewayne HatchDwayne R Rivas, male   DOB: 11/14/1960, 55 y.o.   MRN: [id number]  Patient is 6 weeks status post bilateral knee replacements. He is doing well. He is almost completed physical therapy. He is able to achieve 110-120 range of motion.  He takes Tylenol for aching pain. Patient is doing very well from bilateral knee replacements. He may discontinue aspirin at this point. Increase activity as tolerated continue with home exercises. We'll see him back in 6 weeks for recheck of bilateral knees. No x-rays needed.

## 2016-02-13 ENCOUNTER — Encounter (INDEPENDENT_AMBULATORY_CARE_PROVIDER_SITE_OTHER): Payer: Self-pay | Admitting: Orthopaedic Surgery

## 2016-02-13 ENCOUNTER — Ambulatory Visit (INDEPENDENT_AMBULATORY_CARE_PROVIDER_SITE_OTHER): Payer: BLUE CROSS/BLUE SHIELD | Admitting: Orthopaedic Surgery

## 2016-02-13 DIAGNOSIS — M170 Bilateral primary osteoarthritis of knee: Secondary | ICD-10-CM

## 2016-02-13 DIAGNOSIS — Z96653 Presence of artificial knee joint, bilateral: Secondary | ICD-10-CM

## 2016-02-13 NOTE — Progress Notes (Signed)
Mr. Daniel Rivas is 11 weeks status post bilateral total knee replacements. He has minimal pain. He is doing well. He has excellent range of motion. He is walking without any assistance. He's back to work. Patient is really without any significant complaints. He is very happy. He will continue to do his home exercises. I reminded him of his dental prophylaxis. I will see him back in 9 months with repeat 2 view x-rays of bilateral knees.

## 2016-04-27 ENCOUNTER — Telehealth (INDEPENDENT_AMBULATORY_CARE_PROVIDER_SITE_OTHER): Payer: Self-pay | Admitting: Orthopaedic Surgery

## 2016-04-27 NOTE — Telephone Encounter (Signed)
PT'S REFERRING DR. REQUESTED ALL VISIT NOTES FAXED TO THEM.  J[facility]-[phone]

## 2016-04-28 NOTE — Telephone Encounter (Signed)
Does something need to be signed before doing this?

## 2016-04-29 NOTE — Telephone Encounter (Signed)
RECORDS FAXED TO REFERRING MD AS REQUESTED

## 2016-11-12 ENCOUNTER — Ambulatory Visit (INDEPENDENT_AMBULATORY_CARE_PROVIDER_SITE_OTHER): Payer: BLUE CROSS/BLUE SHIELD | Admitting: Orthopaedic Surgery

## 2016-11-16 ENCOUNTER — Ambulatory Visit (INDEPENDENT_AMBULATORY_CARE_PROVIDER_SITE_OTHER): Payer: BLUE CROSS/BLUE SHIELD | Admitting: Orthopaedic Surgery

## 2016-11-16 ENCOUNTER — Ambulatory Visit (INDEPENDENT_AMBULATORY_CARE_PROVIDER_SITE_OTHER): Payer: BLUE CROSS/BLUE SHIELD

## 2016-11-16 ENCOUNTER — Encounter (INDEPENDENT_AMBULATORY_CARE_PROVIDER_SITE_OTHER): Payer: Self-pay | Admitting: Orthopaedic Surgery

## 2016-11-16 DIAGNOSIS — Z96653 Presence of artificial knee joint, bilateral: Secondary | ICD-10-CM | POA: Diagnosis not present

## 2016-11-16 DIAGNOSIS — M25561 Pain in right knee: Secondary | ICD-10-CM

## 2016-11-16 NOTE — Progress Notes (Signed)
   Office Visit Note   Patient: Daniel Rivas           Date of Birth: 04/07/1960           MRN: [id number] Visit Date: 11/16/2016              Requested by: No referring provider defined for this encounter. PCP: Crista ElliotMcGrath, James   Assessment & Plan: Visit Diagnoses:  1. Status post total bilateral knee replacement     Plan: Patient is one-year status post bilateral total knee replacement.  Dental prophylaxis was reinforced.  Questions encouraged and answered.  Follow-up in 1 year with 2 view x-rays of bilateral knees  Follow-Up Instructions: Return in about 1 year (around 11/16/2017).   Orders:  Orders Placed This Encounter  Procedures  . XR KNEE 3 VIEW RIGHT  . XR KNEE 3 VIEW LEFT   No orders of the defined types were placed in this encounter.     Procedures: No procedures performed   Clinical Data: No additional findings.   Subjective: Chief Complaint  Patient presents with  . Left Knee - Pain  . Right Knee - Pain    He is status post bilateral total knee replacements.  Overall he is doing well and not complaining of any pain.  He had a fall recently but he is recovered from this is been able to ambulate just fine.  He does have trouble with kneeling onto the ground.    Review of Systems   Objective: Vital Signs: There were no vitals taken for this visit.  Physical Exam  Ortho Exam Bilateral knee exam shows fully healed surgical scars.  Excellent range of motion. Specialty Comments:  No specialty comments available.  Imaging: Xr Knee 3 View Left  Result Date: 11/16/2016 Stable total knee replacement in good alignment  Xr Knee 3 View Right  Result Date: 11/16/2016 Stable total knee replacement in good alignment    PMFS History: Patient Active Problem List   Diagnosis Date Noted  . Bilateral primary osteoarthritis of knee 11/25/2015  . Total knee replacement status 11/25/2015   Past Medical History:  Diagnosis Date  . Arthritis   . Sleep  apnea    cpap    History reviewed. No pertinent family history.  Past Surgical History:  Procedure Laterality Date  . ANTERIOR CRUCIATE LIGAMENT REPAIR     bil   Social History   Occupational History  . Not on file  Tobacco Use  . Smoking status: Current Some Day Smoker    Types: Cigars  . Smokeless tobacco: Never Used  Substance and Sexual Activity  . Alcohol use: Yes    Comment: weekly  . Drug use: No  . Sexual activity: Not on file

## 2017-06-25 ENCOUNTER — Other Ambulatory Visit (INDEPENDENT_AMBULATORY_CARE_PROVIDER_SITE_OTHER): Payer: Self-pay

## 2017-06-25 ENCOUNTER — Telehealth (INDEPENDENT_AMBULATORY_CARE_PROVIDER_SITE_OTHER): Payer: Self-pay | Admitting: Orthopaedic Surgery

## 2017-06-25 MED ORDER — AMOXICILLIN 500 MG PO TABS: ORAL_TABLET | ORAL | 0 refills | Status: AC

## 2017-06-25 NOTE — Telephone Encounter (Signed)
Called patient no answer LMOM.

## 2017-06-25 NOTE — Telephone Encounter (Signed)
Sent into the pharm

## 2017-06-25 NOTE — Telephone Encounter (Signed)
Patient called advised he is going to the dentist Monday and will need an antibiotic. The number to contact patient is [phone]

## 2017-06-25 NOTE — Telephone Encounter (Signed)
Please advise 

## 2017-06-25 NOTE — Telephone Encounter (Signed)
2 g amoxicillin 30 mins before

## 2017-08-15 IMAGING — DX DG KNEE 1-2V PORT*L*
2 series · 2 of 2 positions shown · non-contrast
Comparison: None.

CLINICAL DATA: Status post total left knee arthroplasty.

EXAM:
PORTABLE LEFT KNEE - 1-2 VIEW

[knee ap]
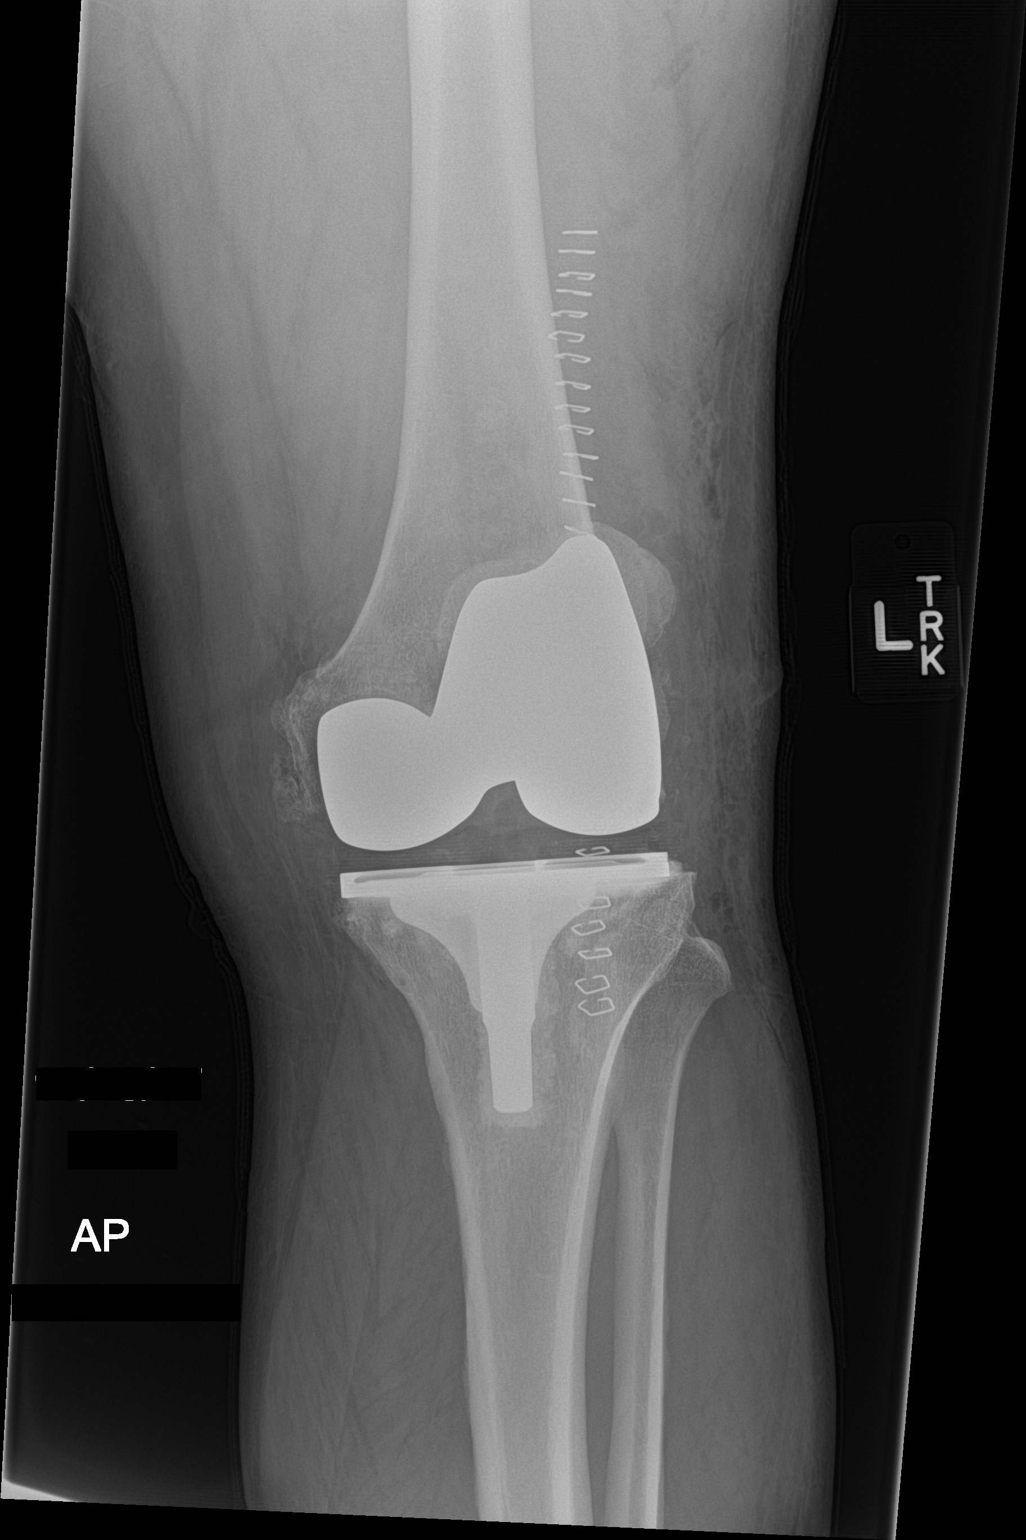

[knee lat]
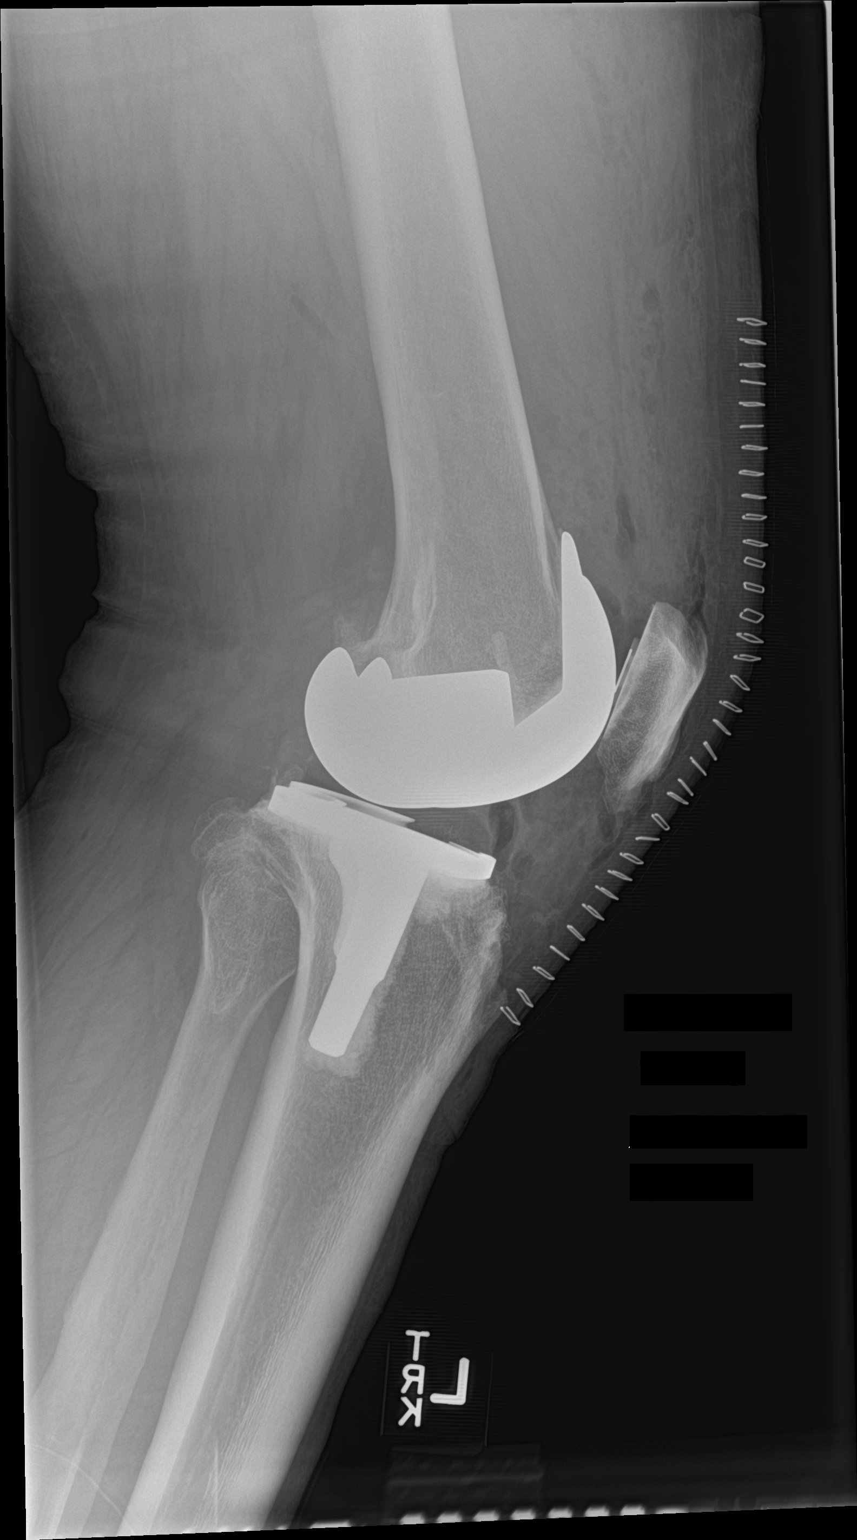

[2 of 2 positions shown; findings below may reference images not displayed]

FINDINGS: The femoral and tibial components are well seated. No complicating
features are demonstrated. Expected postoperative changes.
IMPRESSION: Well seated components of a total knee arthroplasty. No complicating
features.

## 2017-11-16 ENCOUNTER — Ambulatory Visit (INDEPENDENT_AMBULATORY_CARE_PROVIDER_SITE_OTHER): Payer: Self-pay

## 2017-11-16 ENCOUNTER — Ambulatory Visit (INDEPENDENT_AMBULATORY_CARE_PROVIDER_SITE_OTHER): Payer: BLUE CROSS/BLUE SHIELD | Admitting: Orthopaedic Surgery

## 2017-11-16 DIAGNOSIS — Z96653 Presence of artificial knee joint, bilateral: Secondary | ICD-10-CM | POA: Diagnosis not present

## 2017-11-16 NOTE — Progress Notes (Signed)
   Office Visit Note   Patient: Daniel Rivas           Date of Birth: 1960-05-10           MRN: [id number] Visit Date: 11/16/2017              Requested by: No referring provider defined for this encounter. PCP: Crista Elliot   Assessment & Plan: Visit Diagnoses:  1. Status post bilateral knee replacements     Plan: Duane is 2 years status post bilateral total knee replacements and doing well overall.  At this point I would like to see him back in another year for his 3-year visit with three-view x-rays of bilateral knees.  Follow-Up Instructions: Return in about 1 year (around 11/17/2018).   Orders:  Orders Placed This Encounter  Procedures  . XR Knee 1-2 Views Left  . XR Knee 1-2 Views Right   No orders of the defined types were placed in this encounter.     Procedures: No procedures performed   Clinical Data: No additional findings.   Subjective: Chief Complaint  Patient presents with  . Right Knee - Follow-up  . Left Knee - Follow-up    Sherrill Raring is a 57 year old gentleman who is 2 years status post bilateral total knee replacements.  He is doing well and is very happy.  He is not taking any pain medicines.  He denies any persistent swelling.  He feels that he has good range of motion and strength.   Review of Systems   Objective: Vital Signs: There were no vitals taken for this visit.  Physical Exam  Ortho Exam Bilateral knee exam shows fully healed surgical scars with excellent range of motion and strength.  Stable to varus valgus Specialty Comments:  No specialty comments available.  Imaging: Xr Knee 1-2 Views Left  Result Date: 11/16/2017 Stable total knee replacement in good alignment without evidence of complications  Xr Knee 1-2 Views Right  Result Date: 11/16/2017 Stable total knee replacement in good alignment without evidence of complications.    PMFS History: Patient Active Problem List   Diagnosis Date Noted  . Bilateral primary  osteoarthritis of knee 11/25/2015  . Total knee replacement status 11/25/2015   Past Medical History:  Diagnosis Date  . Arthritis   . Sleep apnea    cpap    No family history on file.  Past Surgical History:  Procedure Laterality Date  . ANTERIOR CRUCIATE LIGAMENT REPAIR     bil  . TOTAL KNEE ARTHROPLASTY Bilateral 11/25/2015   Procedure: TOTAL KNEE BILATERAL;  Surgeon: Tarry Kos, MD;  Location: [facility];  Service: Orthopedics;  Laterality: Bilateral;   Social History   Occupational History  . Not on file  Tobacco Use  . Smoking status: Current Some Day Smoker    Types: Cigars  . Smokeless tobacco: Never Used  Substance and Sexual Activity  . Alcohol use: Yes    Comment: weekly  . Drug use: No  . Sexual activity: Not on file

## 2018-11-18 ENCOUNTER — Ambulatory Visit (INDEPENDENT_AMBULATORY_CARE_PROVIDER_SITE_OTHER): Payer: Self-pay | Admitting: Orthopaedic Surgery

## 2018-11-18 ENCOUNTER — Other Ambulatory Visit: Payer: Self-pay

## 2018-11-18 ENCOUNTER — Ambulatory Visit (INDEPENDENT_AMBULATORY_CARE_PROVIDER_SITE_OTHER): Payer: Self-pay

## 2018-11-18 ENCOUNTER — Ambulatory Visit: Payer: Self-pay

## 2018-11-18 DIAGNOSIS — Z96653 Presence of artificial knee joint, bilateral: Secondary | ICD-10-CM

## 2018-11-18 NOTE — Progress Notes (Signed)
   Office Visit Note   Patient: Daniel Rivas           Date of Birth: 1960/02/09           MRN: [id number] Visit Date: 11/18/2018              Requested by: No referring provider defined for this encounter. PCP: Theresia Lo   Assessment & Plan: Visit Diagnoses:  1. History of bilateral knee replacement     Plan: Impression is 3 years status post bilateral total knee replacements.  Patient is doing well from our standpoint.  I would like to see him back in 2 years for his 5-year visit with 2 view x-rays of bilateral knees.  If he is doing well at that time he can follow-up as needed.  Follow-Up Instructions: Return in about 2 years (around 11/17/2020).   Orders:  Orders Placed This Encounter  Procedures  . XR KNEE 3 VIEW RIGHT  . XR KNEE 3 VIEW LEFT   No orders of the defined types were placed in this encounter.     Procedures: No procedures performed   Clinical Data: No additional findings.   Subjective: Chief Complaint  Patient presents with  . Left Knee - Follow-up  . Right Knee - Follow-up    Daniel Rivas is 3 years status post bilateral total knee replacements.  He is doing well has no complaints.  He has been active.  He denies any swelling.   Review of Systems   Objective: Vital Signs: There were no vitals taken for this visit.  Physical Exam  Ortho Exam Of bilateral knee exams show fully healed surgical scars.  He has excellent range of motion and good quad strength.  Normal gait. Specialty Comments:  No specialty comments available.  Imaging: Xr Knee 3 View Left  Result Date: 11/18/2018 Stable total knee replacement without complication  Xr Knee 3 View Right  Result Date: 11/18/2018 Stable total knee replacement without complication.    PMFS History: Patient Active Problem List   Diagnosis Date Noted  . Bilateral primary osteoarthritis of knee 11/25/2015  . Total knee replacement status 11/25/2015   Past Medical History:  Diagnosis  Date  . Arthritis   . Sleep apnea    cpap    No family history on file.  Past Surgical History:  Procedure Laterality Date  . ANTERIOR CRUCIATE LIGAMENT REPAIR     bil  . TOTAL KNEE ARTHROPLASTY Bilateral 11/25/2015   Procedure: TOTAL KNEE BILATERAL;  Surgeon: Leandrew Koyanagi, MD;  Location: [facility];  Service: Orthopedics;  Laterality: Bilateral;   Social History   Occupational History  . Not on file  Tobacco Use  . Smoking status: Current Some Day Smoker    Types: Cigars  . Smokeless tobacco: Never Used  Substance and Sexual Activity  . Alcohol use: Yes    Comment: weekly  . Drug use: No  . Sexual activity: Not on file

## 2020-11-19 ENCOUNTER — Encounter: Payer: Self-pay | Admitting: Orthopaedic Surgery

## 2020-11-19 ENCOUNTER — Ambulatory Visit: Payer: Self-pay

## 2020-11-19 ENCOUNTER — Other Ambulatory Visit: Payer: Self-pay

## 2020-11-19 ENCOUNTER — Ambulatory Visit (INDEPENDENT_AMBULATORY_CARE_PROVIDER_SITE_OTHER): Payer: No Typology Code available for payment source | Admitting: Orthopaedic Surgery

## 2020-11-19 ENCOUNTER — Ambulatory Visit (INDEPENDENT_AMBULATORY_CARE_PROVIDER_SITE_OTHER): Payer: No Typology Code available for payment source

## 2020-11-19 DIAGNOSIS — M25562 Pain in left knee: Secondary | ICD-10-CM

## 2020-11-19 DIAGNOSIS — M25561 Pain in right knee: Secondary | ICD-10-CM | POA: Diagnosis not present

## 2020-11-19 DIAGNOSIS — G8929 Other chronic pain: Secondary | ICD-10-CM

## 2020-11-19 NOTE — Progress Notes (Signed)
    Patient: Daniel Rivas           Date of Birth: Dec 26, 1960           MRN: [id number] Visit Date: 11/19/2020 PCP: Crista Elliot   Assessment & Plan:  Chief Complaint:  Chief Complaint  Patient presents with   Left Knee - Follow-up   Right Knee - Follow-up   Visit Diagnoses:  1. Chronic pain of both knees     Plan: Daniel Rivas is 5 years status post bilateral total knee replacements.  He is doing well and has no concerns or complaints.  He works in [employer].  Bilateral knees show excellent range of motion.  No varus valgus instability.  No joint effusion.  The x-rays demonstrate stable total knee implants without any complications.  At this point we will release him to follow-up as needed.  Follow-Up Instructions: No follow-ups on file.   Orders:  Orders Placed This Encounter  Procedures   XR KNEE 3 VIEW RIGHT   XR KNEE 3 VIEW LEFT   No orders of the defined types were placed in this encounter.   Imaging: XR KNEE 3 VIEW LEFT  Result Date: 11/19/2020 Stable total knee replacement without complication  XR KNEE 3 VIEW RIGHT  Result Date: 11/19/2020 Stable total knee replacement without complication.   PMFS History: Patient Active Problem List   Diagnosis Date Noted   Bilateral primary osteoarthritis of knee 11/25/2015   Total knee replacement status 11/25/2015   Past Medical History:  Diagnosis Date   Arthritis    Sleep apnea    cpap    History reviewed. No pertinent family history.  Past Surgical History:  Procedure Laterality Date   ANTERIOR CRUCIATE LIGAMENT REPAIR     bil   TOTAL KNEE ARTHROPLASTY Bilateral 11/25/2015   Procedure: TOTAL KNEE BILATERAL;  Surgeon: Tarry Kos, MD;  Location: [facility];  Service: Orthopedics;  Laterality: Bilateral;   Social History   Occupational History   Not on file  Tobacco Use   Smoking status: Some Days    Types: Cigars   Smokeless tobacco: Never  Substance and Sexual Activity   Alcohol use: Yes    Comment:  weekly   Drug use: No   Sexual activity: Not on file
